# Patient Record
Sex: Male | Born: 1990 | Race: Black or African American | Hispanic: No | Marital: Single | Smoking: Current every day smoker
Health system: Southern US, Community
[De-identification: ages and names within clinical notes are randomized; demographics above are authoritative.]

---
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
## 2008-02-02 ENCOUNTER — Emergency Department (HOSPITAL_COMMUNITY): Admission: EM | Admit: 2008-02-02 | Discharge: 2008-02-03 | Payer: Self-pay | Admitting: Emergency Medicine

## 2008-02-04 ENCOUNTER — Emergency Department (HOSPITAL_COMMUNITY): Admission: EM | Admit: 2008-02-04 | Discharge: 2008-02-04 | Payer: Self-pay | Admitting: Emergency Medicine

## 2008-06-11 ENCOUNTER — Emergency Department (HOSPITAL_COMMUNITY): Admission: EM | Admit: 2008-06-11 | Discharge: 2008-06-11 | Payer: Self-pay | Admitting: Emergency Medicine

## 2008-11-21 IMAGING — CT CT PELVIS W/ CM
1 of 3 series · 14 of 32 positions shown, 19 images · IV contrast (Omnipaque 300)
Comparison: None

CLINICAL DATA: Abdominal pain

CT ABDOMEN WITH CONTRAST,CT PELVIS WITH CONTRAST
TECHNIQUE: Multidetector CT imaging of the abdomen was performed
following the standard protocol during bolus administration of
intravenous contrast.,Technique:  Multidetector CT imaging of the
pelvis was performed following the standard protocol during
Contrast: 100 ml Imnipaque-1UU IV and oral contrast media

[Series 2: abd_pel 5.0 b40f · axial · 0.69mm/px · z∈[-498,-82]mm · 14 of 95 slices shown, 19 images]
[im 6/95  soft-tissue]
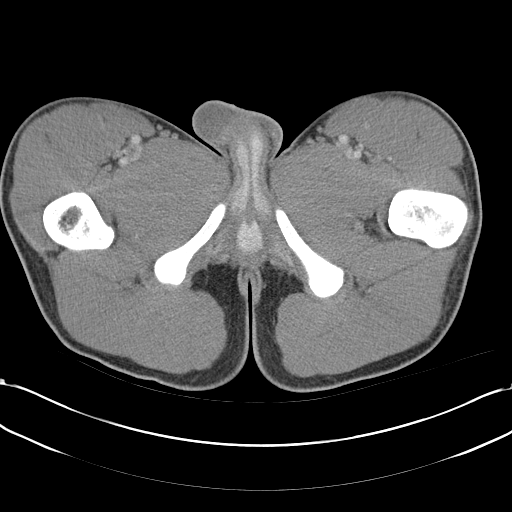
[im 6/95  bone]
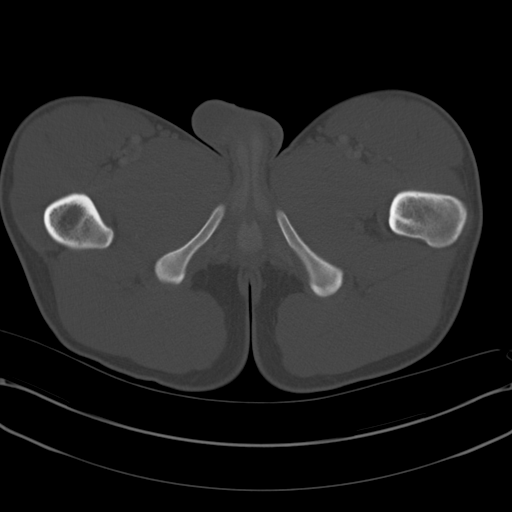
[im 12/95  soft-tissue]
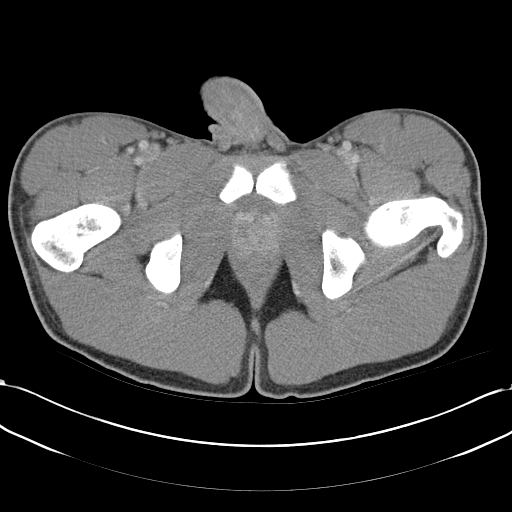
[im 23/95  soft-tissue]
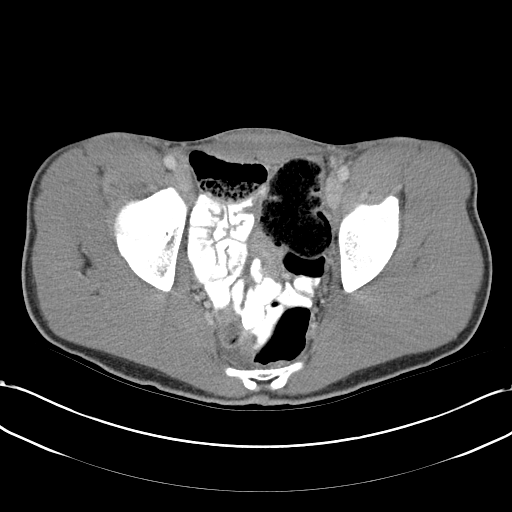
[im 28/95  soft-tissue]
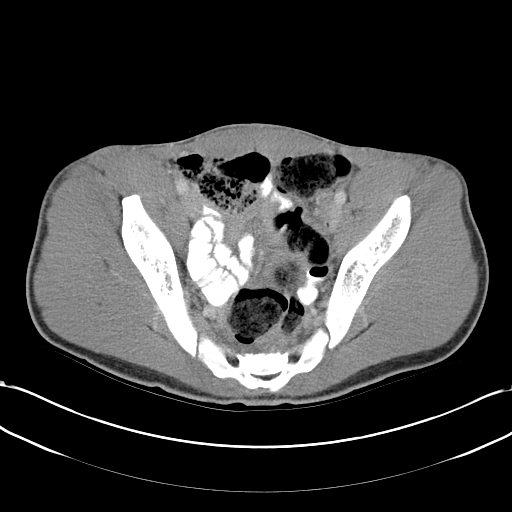
[im 34/95  soft-tissue]
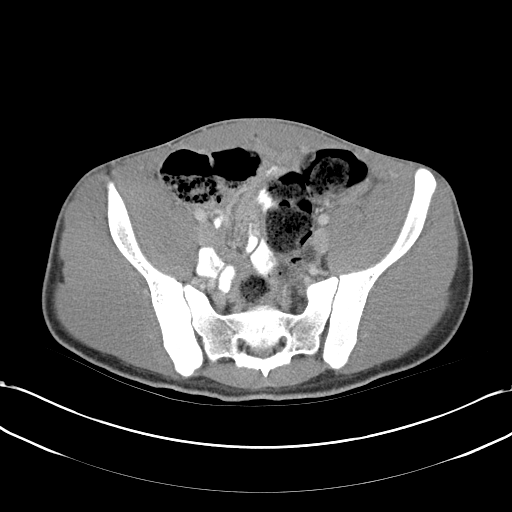
[im 39/95  soft-tissue]
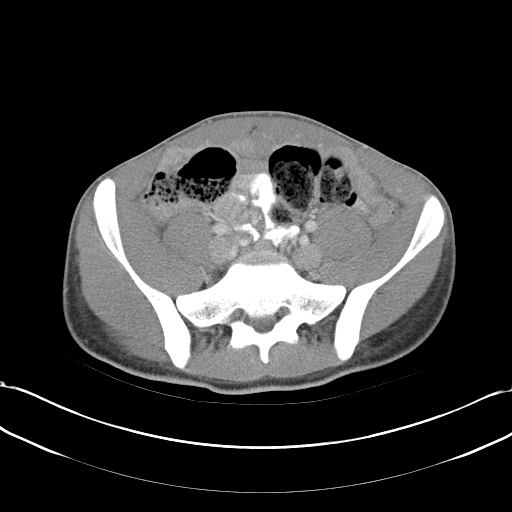
[im 50/95  soft-tissue]
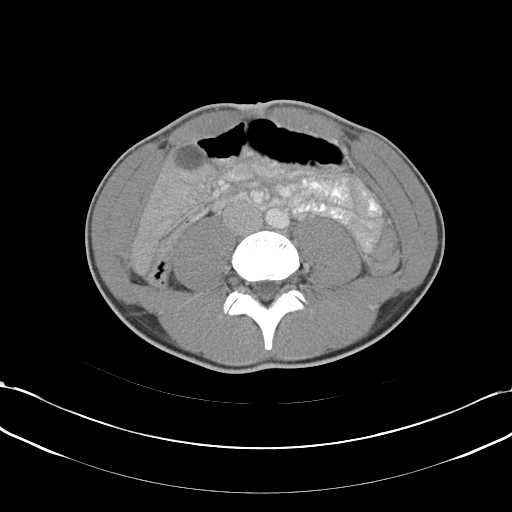
[im 56/95  soft-tissue]
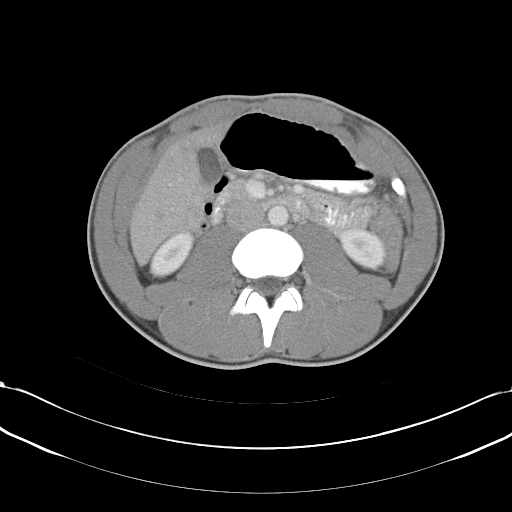
[im 61/95  soft-tissue]
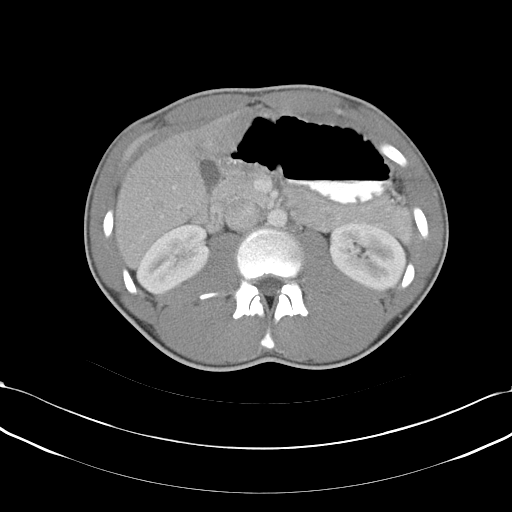
[im 61/95  bone]
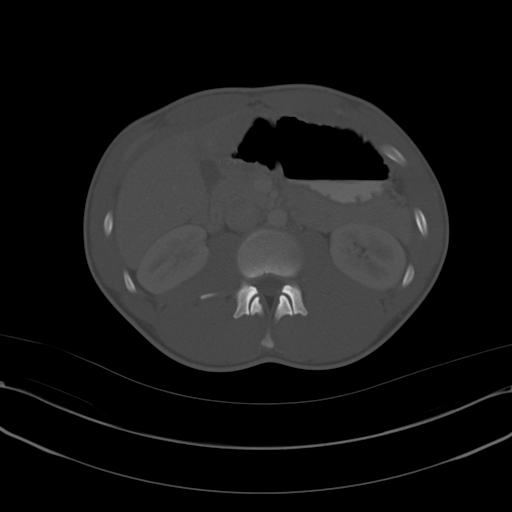
[im 67/95  soft-tissue]
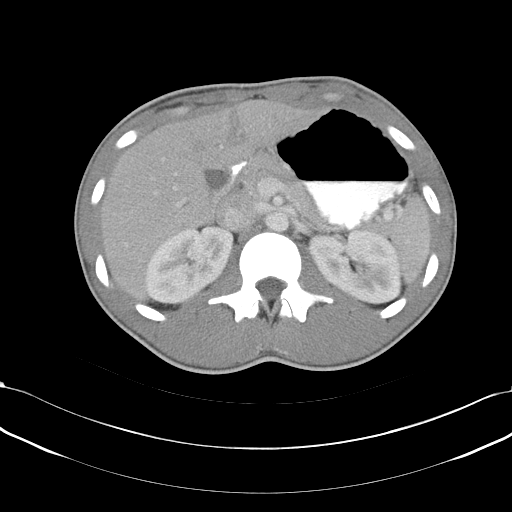
[im 72/95  soft-tissue]
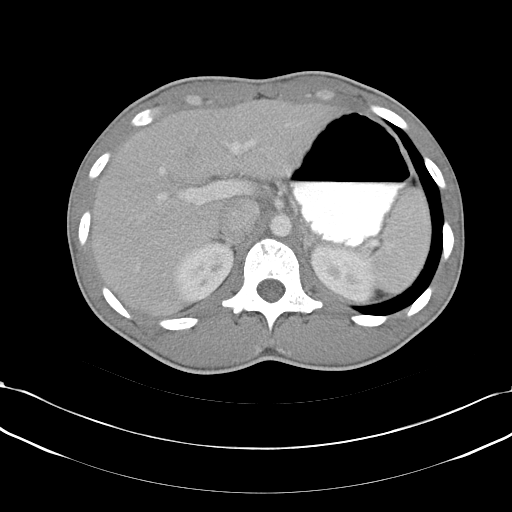
[im 72/95  lung]
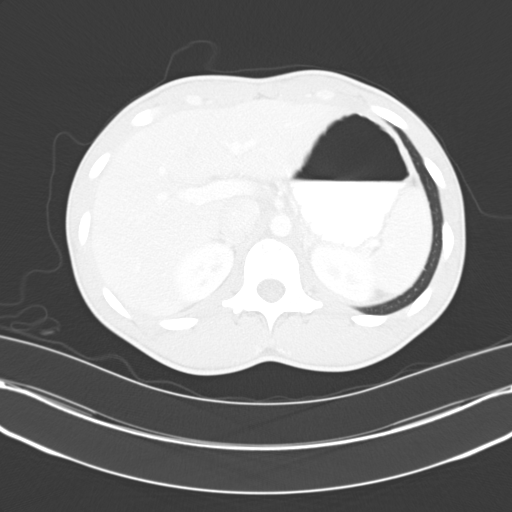
[im 78/95  lung]
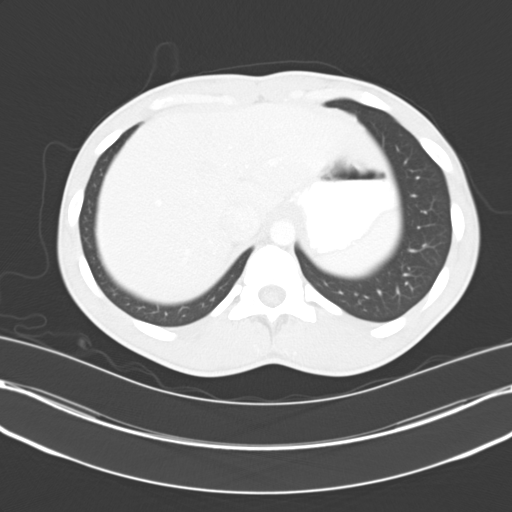
[im 83/95  soft-tissue]
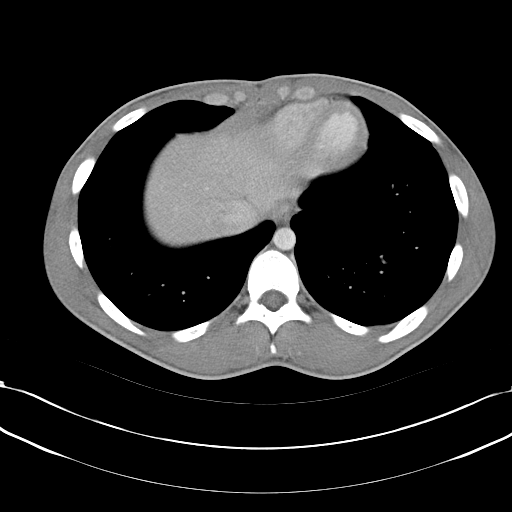
[im 83/95  lung]
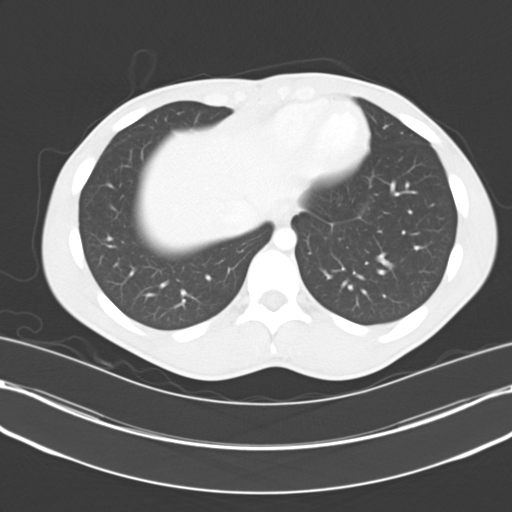
[im 89/95  soft-tissue]
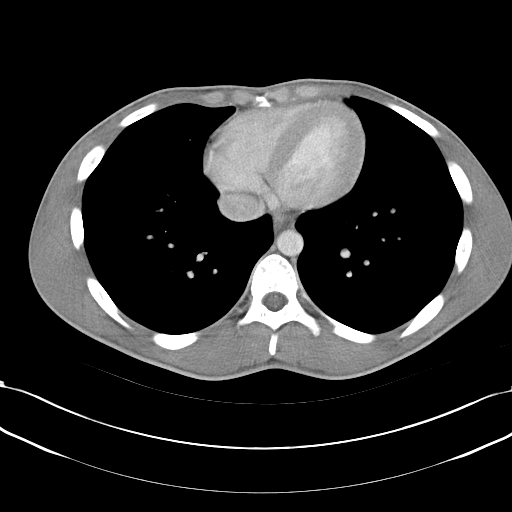
[im 89/95  lung]
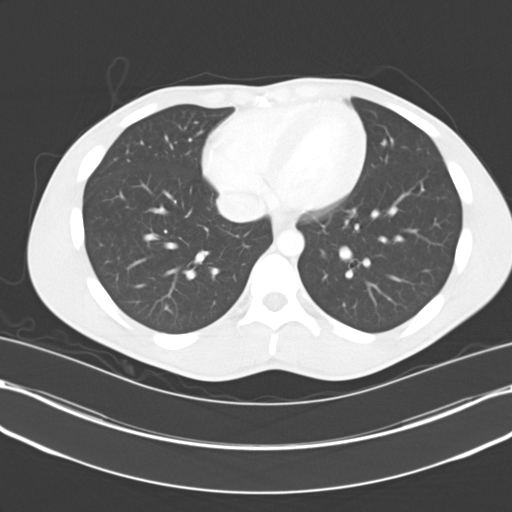

[14 of 32 positions shown; findings below may reference images not displayed]

FINDINGS: Unremarkable appearance of the liver cyst, spleen,
pancreas, kidneys, and adrenal glands.  No acute findings.  No free
fluid or free air.  No findings to suggest acute appendicitis or
diverticulitis.  There is a generous amounts stool in the colon.
IMPRESSION: No acute abdominal findings.

CT PELVIS WITH CONTRAST: No pelvic mass or free fluid.  Bladder
unremarkable.  No unusual bowel wall thickening.
IMPRESSION: No pelvic  findings.

## 2010-02-09 ENCOUNTER — Emergency Department (HOSPITAL_COMMUNITY): Admission: EM | Admit: 2010-02-09 | Discharge: 2010-02-09 | Payer: Self-pay | Admitting: Emergency Medicine

## 2011-06-02 LAB — DIFFERENTIAL
Basophils Absolute: 0
Basophils Relative: 0
Eosinophils Absolute: 0.2
Eosinophils Relative: 3
Lymphocytes Relative: 31
Lymphs Abs: 2
Monocytes Absolute: 0.6
Monocytes Relative: 8
Neutro Abs: 3.8
Neutrophils Relative %: 57

## 2011-06-02 LAB — CBC
HCT: 42.4
Hemoglobin: 15.1
MCHC: 35.6
MCV: 90.9
Platelets: 308
RBC: 4.67
RDW: 14.2
WBC: 6.7

## 2011-06-02 LAB — URINALYSIS, ROUTINE W REFLEX MICROSCOPIC
Glucose, UA: NEGATIVE
Hgb urine dipstick: NEGATIVE
Ketones, ur: 15 — AB
Nitrite: NEGATIVE
Protein, ur: NEGATIVE
Specific Gravity, Urine: >1.03 — ABNORMAL HIGH
Urobilinogen, UA: 0.2
pH: 5.5

## 2011-09-11 ENCOUNTER — Encounter: Payer: Self-pay | Admitting: *Deleted

## 2011-09-11 ENCOUNTER — Emergency Department (HOSPITAL_COMMUNITY)
Admission: EM | Admit: 2011-09-11 | Discharge: 2011-09-11 | Disposition: A | Discharge status: Home / Self Care | Payer: Self-pay | Attending: Emergency Medicine | Admitting: Emergency Medicine

## 2011-09-11 DIAGNOSIS — R51 Headache: Secondary | ICD-10-CM | POA: Insufficient documentation

## 2011-09-11 DIAGNOSIS — F17200 Nicotine dependence, unspecified, uncomplicated: Secondary | ICD-10-CM | POA: Insufficient documentation

## 2011-09-11 DIAGNOSIS — J069 Acute upper respiratory infection, unspecified: Secondary | ICD-10-CM | POA: Insufficient documentation

## 2011-09-11 DIAGNOSIS — K029 Dental caries, unspecified: Secondary | ICD-10-CM | POA: Insufficient documentation

## 2011-09-11 DIAGNOSIS — K1370 Unspecified lesions of oral mucosa: Secondary | ICD-10-CM | POA: Insufficient documentation

## 2011-09-11 DIAGNOSIS — K089 Disorder of teeth and supporting structures, unspecified: Secondary | ICD-10-CM | POA: Insufficient documentation

## 2011-09-11 DIAGNOSIS — R6884 Jaw pain: Secondary | ICD-10-CM | POA: Insufficient documentation

## 2011-09-11 DIAGNOSIS — R220 Localized swelling, mass and lump, head: Secondary | ICD-10-CM | POA: Insufficient documentation

## 2011-09-11 DIAGNOSIS — R221 Localized swelling, mass and lump, neck: Secondary | ICD-10-CM | POA: Insufficient documentation

## 2011-09-11 MED ORDER — AMOXICILLIN 500 MG PO CAPS: ORAL_CAPSULE | ORAL | Status: DC

## 2011-09-11 MED ORDER — PENICILLIN V POTASSIUM 250 MG PO TABS
500.0000 mg | ORAL_TABLET | Freq: Once | ORAL | Status: AC
  Administered 2011-09-11: 500 mg via ORAL
  Filled 2011-09-11: qty 2

## 2011-09-11 MED ORDER — ONDANSETRON HCL 4 MG PO TABS
4.0000 mg | ORAL_TABLET | Freq: Once | ORAL | Status: AC
  Administered 2011-09-11: 4 mg via ORAL
  Filled 2011-09-11: qty 1

## 2011-09-11 MED ORDER — AMOXICILLIN 500 MG PO CAPS: 500.0000 mg | ORAL_CAPSULE | Freq: Three times a day (TID) | ORAL | Status: AC

## 2011-09-11 MED ORDER — IBUPROFEN 800 MG PO TABS
800.0000 mg | ORAL_TABLET | Freq: Once | ORAL | Status: AC
  Administered 2011-09-11: 800 mg via ORAL
  Filled 2011-09-11: qty 1

## 2011-09-11 MED ORDER — HYDROCODONE-ACETAMINOPHEN 5-325 MG PO TABS
2.0000 | ORAL_TABLET | Freq: Once | ORAL | Status: AC
  Administered 2011-09-11: 2 via ORAL
  Filled 2011-09-11: qty 2

## 2011-09-11 MED ORDER — HYDROCODONE-ACETAMINOPHEN 5-325 MG PO TABS: 1.0000 | ORAL_TABLET | ORAL | Status: AC | PRN

## 2011-09-11 NOTE — ED Provider Notes (Addendum)
History     CSN: [id number]  Arrival date & time 09/11/11  1027   None     Chief Complaint  Patient presents with  . Dental Pain    (Consider location/radiation/quality/duration/timing/severity/associated sxs/prior treatment) Patient is a 21 y.o. male presenting with tooth pain. The history is provided by the patient.  Dental PainThe primary symptoms include mouth pain and headaches. Primary symptoms do not include shortness of breath, sore throat or cough. The symptoms began yesterday. The symptoms are worsening. The symptoms occur constantly.  The headache is not associated with photophobia.  Additional symptoms include: gum swelling, gum tenderness, jaw pain and facial swelling. Additional symptoms do not include: nosebleeds.    History reviewed. No pertinent past medical history.  History reviewed. No pertinent past surgical history.  History reviewed. No pertinent family history.  History  Substance Use Topics  . Smoking status: Current Everyday Smoker -- 0.5 packs/day  . Smokeless tobacco: Not on file  . Alcohol Use: Yes     occasional      Review of Systems  Constitutional: Negative for activity change.       All ROS Neg except as noted in HPI  HENT: Positive for facial swelling. Negative for nosebleeds, sore throat and neck pain.   Eyes: Negative for photophobia and discharge.  Respiratory: Negative for cough, shortness of breath and wheezing.   Cardiovascular: Negative for chest pain and palpitations.  Gastrointestinal: Negative for abdominal pain and blood in stool.  Genitourinary: Negative for dysuria, frequency and hematuria.  Musculoskeletal: Negative for back pain and arthralgias.  Skin: Negative.   Neurological: Positive for headaches. Negative for dizziness, seizures and speech difficulty.  Psychiatric/Behavioral: Negative for hallucinations and confusion.    Allergies  Review of patient's allergies indicates no known allergies.  Home Medications     Current Outpatient Rx  Name Route Sig Dispense Refill  . ACETAMINOPHEN 500 MG PO TABS Oral Take 1,000 mg by mouth every 6 (six) hours as needed. For pain     . PSEUDOEPHEDRINE HCL 30 MG PO TABS Oral Take 30 mg by mouth every 6 (six) hours as needed. For allergies       BP 135/83  Pulse 74  Temp(Src) 98.2 F (36.8 C) (Oral)  Resp 20  Ht 6' (1.829 m)  Wt 145 lb 5 oz (65.913 kg)  BMI 19.71 kg/m2  SpO2 97%  Physical Exam  Nursing note and vitals reviewed. Constitutional: He is oriented to person, place, and time. He appears well-developed and well-nourished.  Non-toxic appearance.  HENT:  Head: Normocephalic.  Right Ear: Tympanic membrane and external ear normal.  Left Ear: Tympanic membrane and external ear normal.       Multiple dental caries of the lower jaw area. Mild to mod left facial swelling.  Eyes: EOM and lids are normal. Pupils are equal, round, and reactive to light.  Neck: Normal range of motion. Neck supple. Carotid bruit is not present.  Cardiovascular: Normal rate, regular rhythm, normal heart sounds, intact distal pulses and normal pulses.   Pulmonary/Chest: Breath sounds normal. No respiratory distress.  Abdominal: Soft. Bowel sounds are normal. There is no tenderness. There is no guarding.  Musculoskeletal: Normal range of motion.  Lymphadenopathy:       Head (right side): No submandibular adenopathy present.       Head (left side): No submandibular adenopathy present.    He has no cervical adenopathy.  Neurological: He is alert and oriented to person, place, and time. He  has normal strength. No cranial nerve deficit or sensory deficit.  Skin: Skin is warm and dry.  Psychiatric: He has a normal mood and affect. His speech is normal.    ED Course  Procedures (including critical care time)  Labs Reviewed - No data to display No results found.   Dx: toothache.    MDM  I have reviewed nursing notes, vital signs, and all appropriate lab and imaging  results for this patient. Patient advised to see a dentist as soon as possible. Prescription for Amoxil 500 mg and Norco 5 mg given.       Kathie Dike, PA  [place], [place] 09/11/11 [phone]

## 2011-09-11 NOTE — ED Provider Notes (Signed)
Medical screening examination/treatment/procedure(s) were performed by non-physician practitioner and as supervising physician I was immediately available for consultation/collaboration.   Tymesha Ditmore L Pranay Hilbun, MD 09/11/11 1536 

## 2011-09-11 NOTE — ED Notes (Signed)
Pt c/o pain to left lower jaw and swelling.

## 2011-09-13 NOTE — ED Provider Notes (Signed)
Medical screening examination/treatment/procedure(s) were performed by non-physician practitioner and as supervising physician I was immediately available for consultation/collaboration.   Sherry Rogus L Jakayden Cancio, MD 09/13/11 1541 

## 2012-03-22 ENCOUNTER — Emergency Department (HOSPITAL_COMMUNITY)
Admission: EM | Admit: 2012-03-22 | Discharge: 2012-03-22 | Disposition: A | Discharge status: Home / Self Care | Payer: Self-pay | Attending: Emergency Medicine | Admitting: Emergency Medicine

## 2012-03-22 ENCOUNTER — Encounter (HOSPITAL_COMMUNITY): Payer: Self-pay | Admitting: *Deleted

## 2012-03-22 DIAGNOSIS — K047 Periapical abscess without sinus: Secondary | ICD-10-CM | POA: Insufficient documentation

## 2012-03-22 DIAGNOSIS — F17200 Nicotine dependence, unspecified, uncomplicated: Secondary | ICD-10-CM | POA: Insufficient documentation

## 2012-03-22 MED ORDER — PENICILLIN V POTASSIUM 250 MG PO TABS
500.0000 mg | ORAL_TABLET | Freq: Once | ORAL | Status: AC
  Administered 2012-03-22: 500 mg via ORAL
  Filled 2012-03-22: qty 2

## 2012-03-22 MED ORDER — HYDROCODONE-ACETAMINOPHEN 5-325 MG PO TABS
1.0000 | ORAL_TABLET | Freq: Once | ORAL | Status: AC
  Administered 2012-03-22: 1 via ORAL
  Filled 2012-03-22: qty 1

## 2012-03-22 MED ORDER — HYDROCODONE-ACETAMINOPHEN 5-325 MG PO TABS: 1.0000 | ORAL_TABLET | ORAL | Status: AC | PRN

## 2012-03-22 MED ORDER — PENICILLIN V POTASSIUM 500 MG PO TABS: 500.0000 mg | ORAL_TABLET | Freq: Three times a day (TID) | ORAL | Status: AC

## 2012-03-22 MED ORDER — HYDROCODONE-ACETAMINOPHEN 5-325 MG PO TABS: 1.0000 | ORAL_TABLET | ORAL | Status: DC | PRN

## 2012-03-22 MED ORDER — PENICILLIN V POTASSIUM 500 MG PO TABS: 500.0000 mg | ORAL_TABLET | Freq: Once | ORAL | Status: DC

## 2012-03-22 NOTE — ED Notes (Signed)
Pt c/o rt lower tooth pain since this am. Pt state it is a cavity that needs to be filled.

## 2012-03-22 NOTE — ED Provider Notes (Signed)
History     CSN: [id number]  Arrival date & time 03/22/12  1305   First MD Initiated Contact with Patient 03/22/12 1317      Chief Complaint  Patient presents with  . Dental Pain    (Consider location/radiation/quality/duration/timing/severity/associated sxs/prior treatment) HPI Comments: Harry Sullivan  presents with a 1 day history of dental pain and gingival swelling.   The patient has a history of injury to the tooth involved which has recently started to cause pain.  There has been no fevers,  Chills, nausea or vomiting, also no complaint of difficulty swallowing,  Although chewing makes pain worse.  The patient has tried ibuprofen and excedrin without relief of symptoms.      The history is provided by the patient.    History reviewed. No pertinent past medical history.  History reviewed. No pertinent past surgical history.  History reviewed. No pertinent family history.  History  Substance Use Topics  . Smoking status: Current Everyday Smoker -- 0.5 packs/day  . Smokeless tobacco: Not on file  . Alcohol Use: Yes     occasional      Review of Systems  Constitutional: Negative for fever.  HENT: Positive for dental problem. Negative for sore throat, facial swelling, neck pain and neck stiffness.   Respiratory: Negative for shortness of breath.     Allergies  Review of patient's allergies indicates no known allergies.  Home Medications   Current Outpatient Rx  Name Route Sig Dispense Refill  . ASPIRIN-ACETAMINOPHEN-CAFFEINE 250-250-65 MG PO TABS Oral Take 2 tablets by mouth every 6 (six) hours as needed. For headache    . IBUPROFEN 200 MG PO TABS Oral Take 600 mg by mouth every 6 (six) hours as needed. For tooth pain    . HYDROCODONE-ACETAMINOPHEN 5-325 MG PO TABS Oral Take 1 tablet by mouth every 4 (four) hours as needed for pain. 20 tablet 0  . PENICILLIN V POTASSIUM 500 MG PO TABS Oral Take 1 tablet (500 mg total) by mouth once. 30 tablet 0    BP  149/77  Pulse 60  Temp 98.3 F (36.8 C) (Oral)  Resp 18  Ht 6' (1.829 m)  Wt 150 lb (68.04 kg)  BMI 20.34 kg/m2  SpO2 98%  Physical Exam  Constitutional: He is oriented to person, place, and time. He appears well-developed and well-nourished. No distress.  HENT:  Head: Normocephalic and atraumatic.  Right Ear: Tympanic membrane and external ear normal.  Left Ear: Tympanic membrane and external ear normal.  Mouth/Throat: Oropharynx is clear and moist and mucous membranes are normal. No oral lesions. Dental abscesses present.         Subungual space soft.  Eyes: Conjunctivae are normal.  Neck: Normal range of motion. Neck supple.  Cardiovascular: Normal rate and normal heart sounds.   Pulmonary/Chest: Effort normal.  Lymphadenopathy:    He has no cervical adenopathy.  Neurological: He is alert and oriented to person, place, and time.  Skin: Skin is warm and dry. No erythema.  Psychiatric: He has a normal mood and affect.    ED Course  Procedures (including critical care time)  Labs Reviewed - No data to display No results found.   1. Dental abscess       MDM  Hydrocodone,  Penicillin.  Dental referral numbers given.  No trismus,  No induration of subungual space.  No facial edema.        Burgess Amor, PA 03/22/12 1343  Burgess Amor, PA 03/22/12 1344

## 2012-03-22 NOTE — ED Provider Notes (Signed)
Medical screening examination/treatment/procedure(s) were performed by non-physician practitioner and as supervising physician I was immediately available for consultation/collaboration.   Maely Clements L Carvin Almas, MD 03/22/12 1527 

## 2012-03-22 NOTE — ED Notes (Signed)
Rt mandibular tooth ache. Onset today.

## 2012-10-02 ENCOUNTER — Emergency Department (HOSPITAL_COMMUNITY)
Admission: EM | Admit: 2012-10-02 | Discharge: 2012-10-02 | Disposition: A | Discharge status: Home / Self Care | Payer: Self-pay | Attending: Emergency Medicine | Admitting: Emergency Medicine

## 2012-10-02 ENCOUNTER — Encounter (HOSPITAL_COMMUNITY): Payer: Self-pay | Admitting: Emergency Medicine

## 2012-10-02 ENCOUNTER — Emergency Department (HOSPITAL_COMMUNITY): Payer: Self-pay

## 2012-10-02 DIAGNOSIS — R110 Nausea: Secondary | ICD-10-CM | POA: Insufficient documentation

## 2012-10-02 DIAGNOSIS — F17200 Nicotine dependence, unspecified, uncomplicated: Secondary | ICD-10-CM | POA: Insufficient documentation

## 2012-10-02 DIAGNOSIS — R51 Headache: Secondary | ICD-10-CM | POA: Insufficient documentation

## 2012-10-02 MED ORDER — DIPHENHYDRAMINE HCL 25 MG PO CAPS
50.0000 mg | ORAL_CAPSULE | Freq: Once | ORAL | Status: AC
  Administered 2012-10-02: 50 mg via ORAL
  Filled 2012-10-02: qty 2

## 2012-10-02 MED ORDER — HYDROCODONE-ACETAMINOPHEN 5-325 MG PO TABS: 1.0000 | ORAL_TABLET | ORAL | Status: DC | PRN

## 2012-10-02 MED ORDER — HYDROCODONE-ACETAMINOPHEN 5-325 MG PO TABS
1.0000 | ORAL_TABLET | Freq: Once | ORAL | Status: AC
  Administered 2012-10-02: 1 via ORAL
  Filled 2012-10-02: qty 1

## 2012-10-02 MED ORDER — METOCLOPRAMIDE HCL 10 MG PO TABS
10.0000 mg | ORAL_TABLET | Freq: Once | ORAL | Status: AC
  Administered 2012-10-02: 10 mg via ORAL
  Filled 2012-10-02: qty 1

## 2012-10-02 MED ORDER — IBUPROFEN 800 MG PO TABS
800.0000 mg | ORAL_TABLET | Freq: Once | ORAL | Status: AC
  Administered 2012-10-02: 800 mg via ORAL
  Filled 2012-10-02: qty 1

## 2012-10-02 MED ORDER — PROMETHAZINE HCL 25 MG PO TABS: 25.0000 mg | ORAL_TABLET | Freq: Four times a day (QID) | ORAL | Status: DC | PRN

## 2012-10-02 NOTE — ED Notes (Signed)
Pt states headache since yesterday.denies history of same. Pt states nausea but no vomiting.

## 2012-10-02 NOTE — ED Notes (Signed)
Pt reports headache since yesterday. Mild nausea, no vomiting, no visual disturbances

## 2012-10-03 NOTE — ED Provider Notes (Signed)
Medical screening examination/treatment/procedure(s) were performed by non-physician practitioner and as supervising physician I was immediately available for consultation/collaboration. Devoria Albe, MD, Armando Gang   Ward Givens, MD 10/03/12 [phone]

## 2012-10-03 NOTE — ED Provider Notes (Signed)
History     CSN: [id number]  Arrival date & time 10/02/12  1012   First MD Initiated Contact with Patient 10/02/12 1049      Chief Complaint  Patient presents with  . Headache    (Consider location/radiation/quality/duration/timing/severity/associated sxs/prior treatment) HPI Comments: Harry Sullivan is a 22 y.o. Male presenting with new onset headache which started gradually yesterday.  He denies history of headache.  He reports pain across his forehead to both temples and has noticed slightly blurred vision in his left eye. He denies prodromal symptoms.  He has had slight nausea without emesis.  He denies recent illness including nasal congestion, facial pain,  Earache and sore throat.  There has been no fevers, chills, syncope, confusion or localized weakness.  The patient tried tylenol without relief of symptoms.       The history is provided by the patient.    History reviewed. No pertinent past medical history.  History reviewed. No pertinent past surgical history.  No family history on file.  History  Substance Use Topics  . Smoking status: Current Every Day Smoker -- 0.5 packs/day  . Smokeless tobacco: Not on file  . Alcohol Use: Yes     Comment: occasional      Review of Systems  Constitutional: Negative for fever.  HENT: Negative for ear pain, congestion, sore throat, neck pain and tinnitus.   Eyes: Positive for visual disturbance. Negative for photophobia, pain, discharge and redness.  Respiratory: Negative for chest tightness and shortness of breath.   Cardiovascular: Negative for chest pain.  Gastrointestinal: Negative for nausea and abdominal pain.  Genitourinary: Negative.   Musculoskeletal: Negative for joint swelling and arthralgias.  Skin: Negative.  Negative for rash and wound.  Neurological: Positive for headaches. Negative for dizziness, speech difficulty, weakness, light-headedness and numbness.  Hematological: Negative.     Psychiatric/Behavioral: Negative.     Allergies  Review of patient's allergies indicates no known allergies.  Home Medications   Current Outpatient Rx  Name  Route  Sig  Dispense  Refill  . ACETAMINOPHEN 500 MG PO TABS   Oral   Take 1,000 mg by mouth every 6 (six) hours as needed. For pain         . HYDROCODONE-ACETAMINOPHEN 5-325 MG PO TABS   Oral   Take 1 tablet by mouth every 4 (four) hours as needed for pain.   15 tablet   0   . PROMETHAZINE HCL 25 MG PO TABS   Oral   Take 1 tablet (25 mg total) by mouth every 6 (six) hours as needed for nausea.   15 tablet   0     BP 134/74  Pulse 72  Temp 98 F (36.7 C) (Oral)  Resp 16  Wt 150 lb (68.04 kg)  SpO2 99%  Physical Exam  Nursing note and vitals reviewed. Constitutional: He is oriented to person, place, and time. He appears well-developed and well-nourished.  HENT:  Head: Normocephalic and atraumatic.  Mouth/Throat: Oropharynx is clear and moist.  Eyes: Conjunctivae normal and EOM are normal. Pupils are equal, round, and reactive to light.  Neck: Normal range of motion. Neck supple.  Cardiovascular: Normal rate and normal heart sounds.   Pulmonary/Chest: Effort normal.  Abdominal: Soft. There is no tenderness.  Musculoskeletal: Normal range of motion.  Lymphadenopathy:    He has no cervical adenopathy.  Neurological: He is alert and oriented to person, place, and time. He has normal strength. No sensory deficit. Gait normal. GCS  eye subscore is 4. GCS verbal subscore is 5. GCS motor subscore is 6.       Normal heel-shin, normal rapid alternating movements. Cranial nerves III-XII intact.  No pronator drift.  Skin: Skin is warm and dry. No rash noted.  Psychiatric: He has a normal mood and affect. His speech is normal and behavior is normal. Thought content normal. Cognition and memory are normal.    ED Course  Procedures (including critical care time)  Labs Reviewed - No data to display Ct Head Wo  Contrast  10/02/2012  *RADIOLOGY REPORT*  Clinical Data: Headache for 1 day duration  CT HEAD WITHOUT CONTRAST  Technique:  Contiguous axial images were obtained from the base of the skull through the vertex without contrast.  Comparison: None.  Findings:  The ventricles are normal in size and configuration. There is no mass, hemorrhage, extra-axial fluid collection, or midline shift.  Gray-white compartments are normal.  Bony calvarium appears intact.  The mastoid air cells are clear.  IMPRESSION: Study within normal limits.   Original Report Authenticated By: Bretta Bang, M.D.      1. Headache, acute     Patient was given migraine cocktail including reglan, benadryl and ibuprofen PO (refused IM).  Nausea resolved,  Headache 3/6 from 6/6.  Given hydrocodone prior to dc home.  MDM  CT reviewed and negative.  Discussed with pt and mother.  Nonfocal neuro exam.  Pt's visual acuity 20/20 os/od/ou.  Suspect simple headache with no other exam findings.  Prescribed phenergan, hydrocodone.  F/u with pcp prn.        Burgess Amor, PA 10/03/12 0831  Burgess Amor, PA 10/03/12 [phone]

## 2013-07-20 IMAGING — CT CT HEAD W/O CM
1 series · 16 of 30 positions shown, 20 images · non-contrast
Comparison: None.

CLINICAL DATA: Headache for 1 day duration

CT HEAD WITHOUT CONTRAST
TECHNIQUE: Contiguous axial images were obtained from the base of
the skull through the vertex without contrast.

[Series 2: headtrauma 4.8 h37s · axial · 0.43mm/px · z∈[+53,+207]mm · 16 of 36 slices shown, 20 images]
[im 2/36  brain]
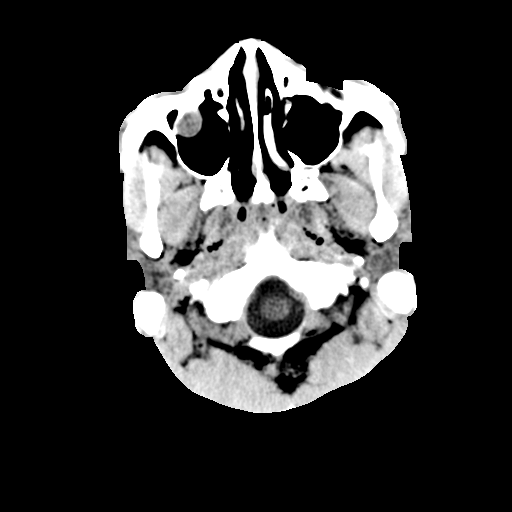
[im 2/36  bone]
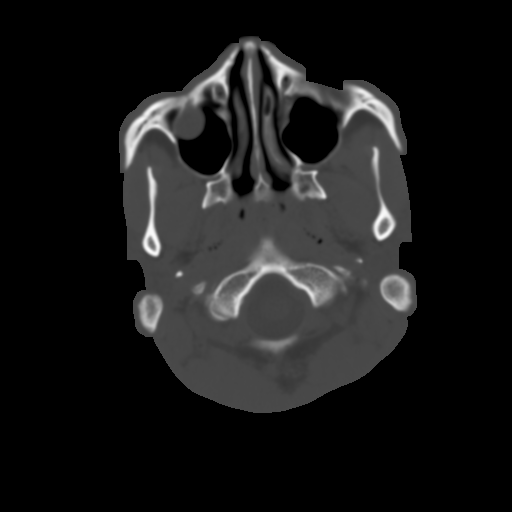
[im 4/36  brain]
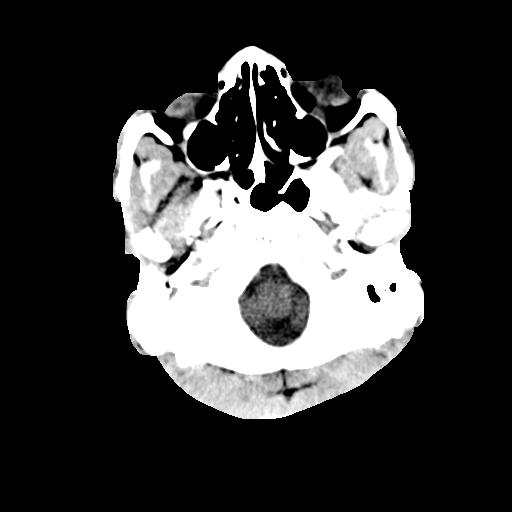
[im 7/36  brain]
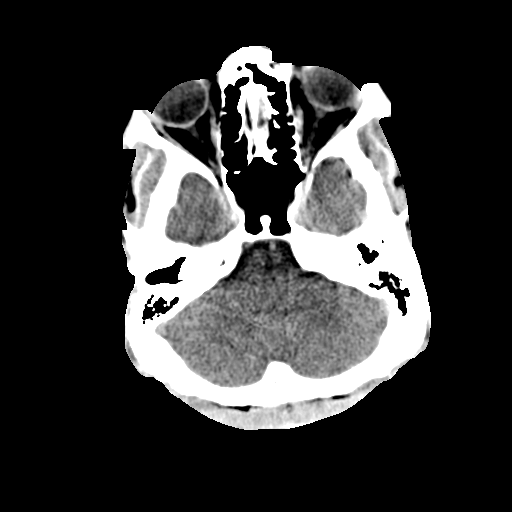
[im 9/36  brain]
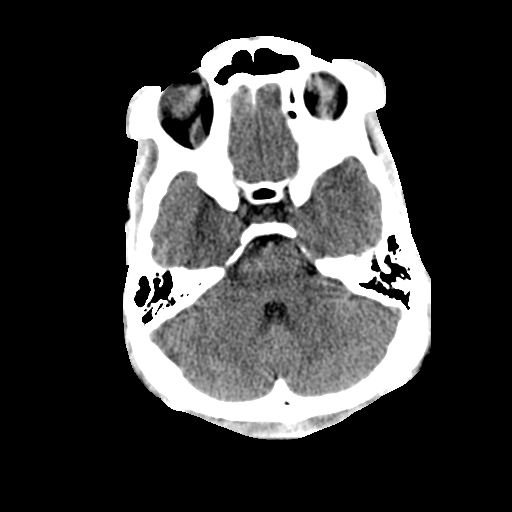
[im 10/36  brain]
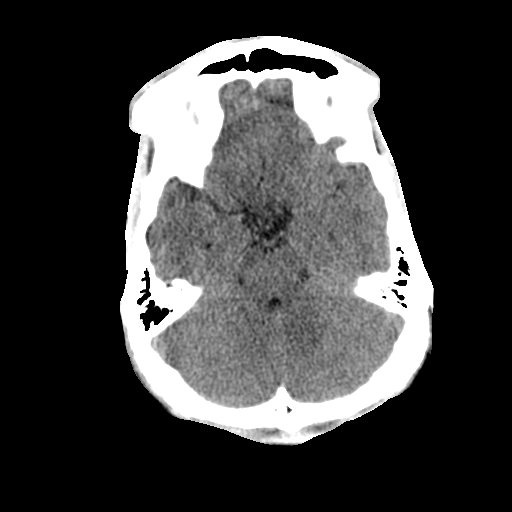
[im 10/36  bone]
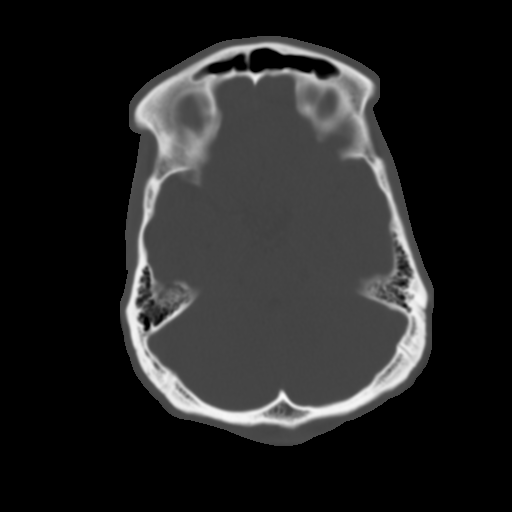
[im 13/36  brain]
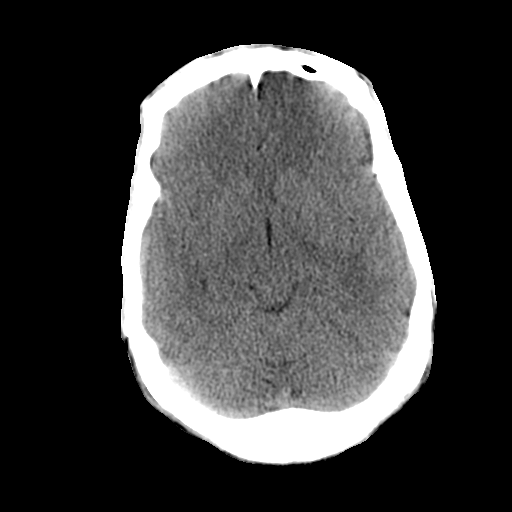
[im 15/36  brain]
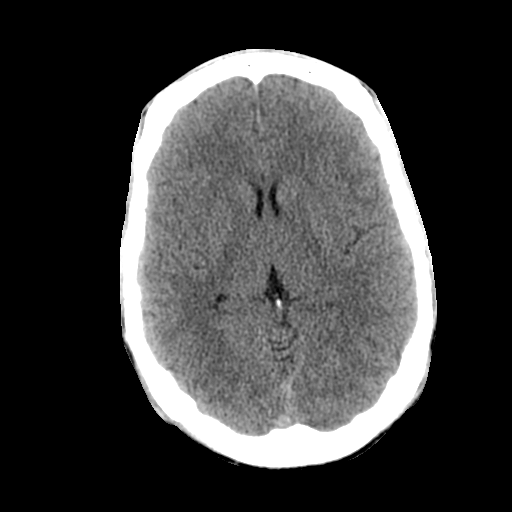
[im 17/36  brain]
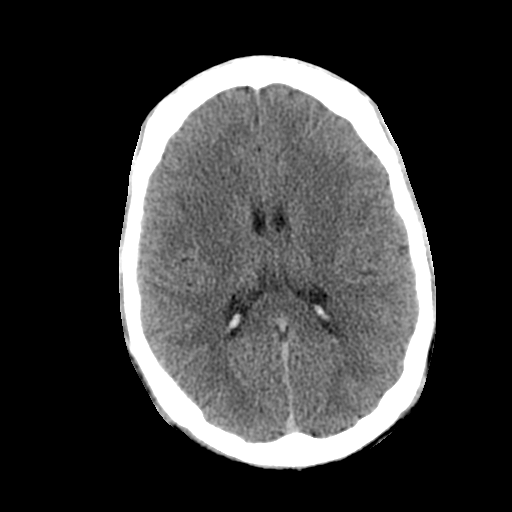
[im 19/36  brain]
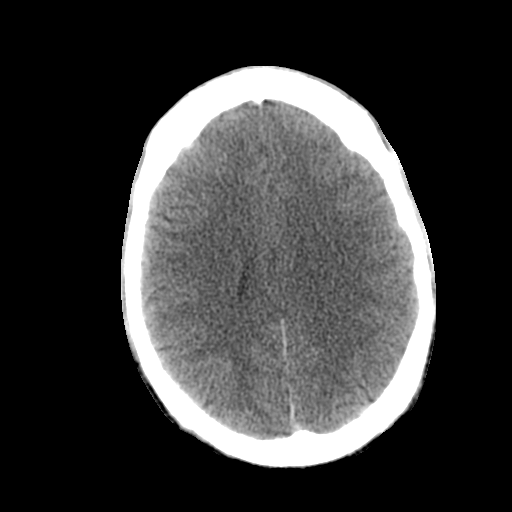
[im 19/36  bone]
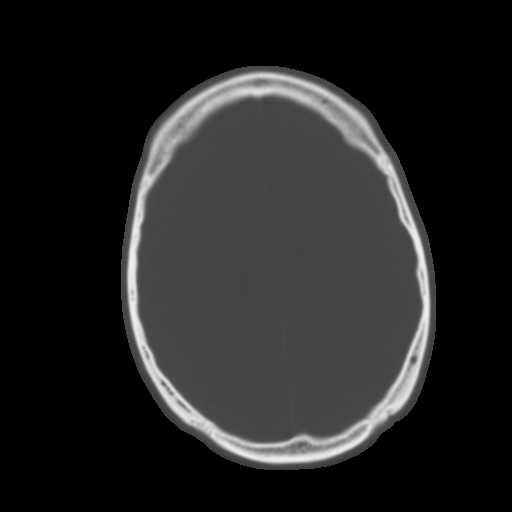
[im 21/36  brain]
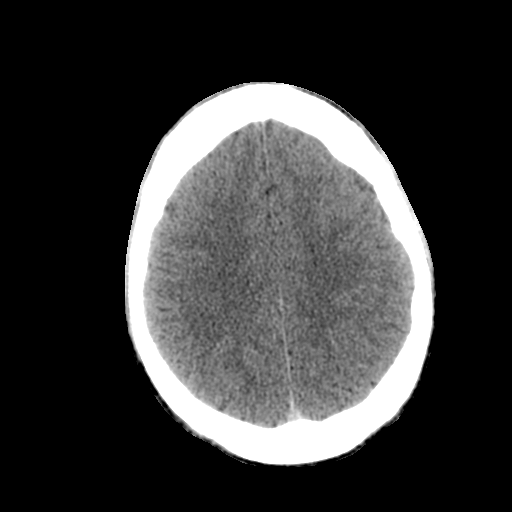
[im 23/36  brain]
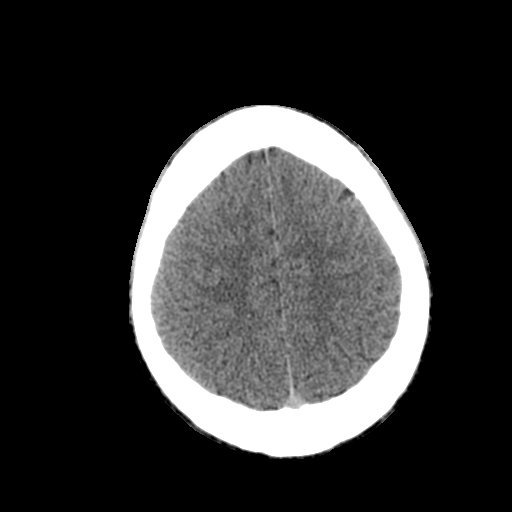
[im 26/36  brain]
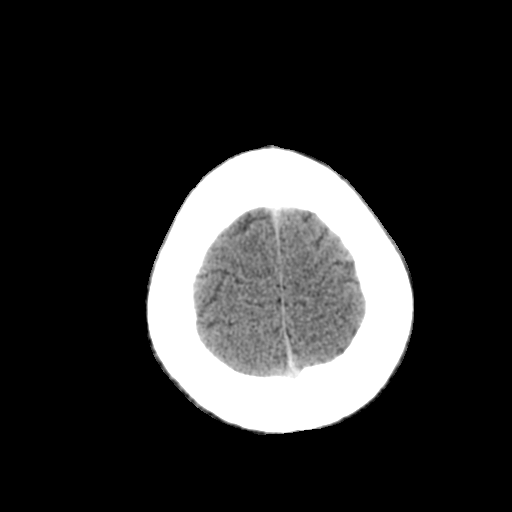
[im 27/36  brain]
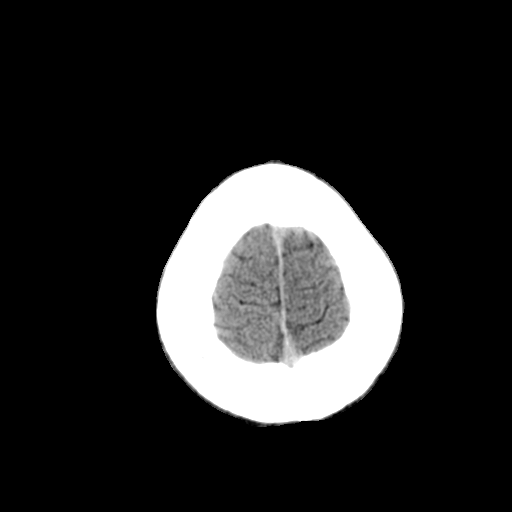
[im 27/36  bone]
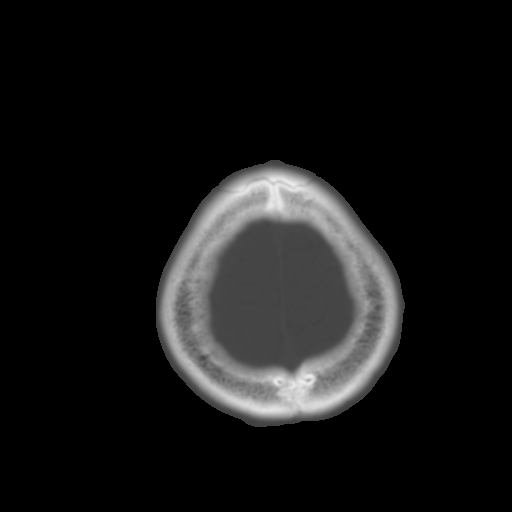
[im 29/36  brain]
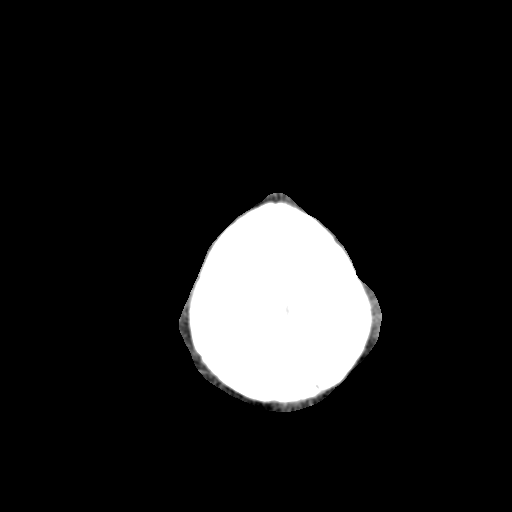
[im 32/36  brain]
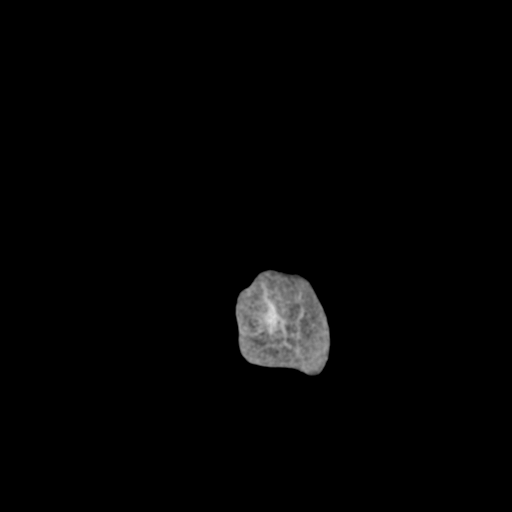
[im 34/36  brain]
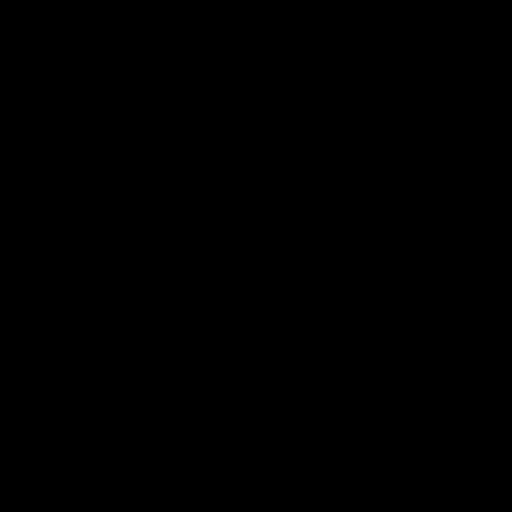

[16 of 30 positions shown; findings below may reference images not displayed]

FINDINGS: The ventricles are normal in size and configuration.
There is no mass, hemorrhage, extra-axial fluid collection, or
midline shift.  Gray-white compartments are normal.  Bony calvarium
appears intact.  The mastoid air cells are clear.
IMPRESSION: Study within normal limits.

## 2014-09-16 ENCOUNTER — Emergency Department (HOSPITAL_COMMUNITY)
Admission: EM | Admit: 2014-09-16 | Discharge: 2014-09-16 | Disposition: A | Discharge status: Home / Self Care | Payer: Self-pay | Attending: Emergency Medicine | Admitting: Emergency Medicine

## 2014-09-16 ENCOUNTER — Encounter (HOSPITAL_COMMUNITY): Payer: Self-pay

## 2014-09-16 DIAGNOSIS — Y939 Activity, unspecified: Secondary | ICD-10-CM | POA: Insufficient documentation

## 2014-09-16 DIAGNOSIS — Y929 Unspecified place or not applicable: Secondary | ICD-10-CM | POA: Insufficient documentation

## 2014-09-16 DIAGNOSIS — X58XXXA Exposure to other specified factors, initial encounter: Secondary | ICD-10-CM | POA: Insufficient documentation

## 2014-09-16 DIAGNOSIS — Y998 Other external cause status: Secondary | ICD-10-CM | POA: Insufficient documentation

## 2014-09-16 DIAGNOSIS — Z720 Tobacco use: Secondary | ICD-10-CM | POA: Insufficient documentation

## 2014-09-16 DIAGNOSIS — S39012A Strain of muscle, fascia and tendon of lower back, initial encounter: Secondary | ICD-10-CM | POA: Insufficient documentation

## 2014-09-16 MED ORDER — BACLOFEN 10 MG PO TABS: 10.0000 mg | ORAL_TABLET | Freq: Three times a day (TID) | ORAL | Status: AC

## 2014-09-16 MED ORDER — NAPROXEN 250 MG PO TABS
500.0000 mg | ORAL_TABLET | Freq: Once | ORAL | Status: AC
  Administered 2014-09-16: 500 mg via ORAL
  Filled 2014-09-16: qty 2

## 2014-09-16 MED ORDER — DICLOFENAC SODIUM 75 MG PO TBEC: 75.0000 mg | DELAYED_RELEASE_TABLET | Freq: Two times a day (BID) | ORAL | Status: DC

## 2014-09-16 MED ORDER — METHOCARBAMOL 500 MG PO TABS
1000.0000 mg | ORAL_TABLET | Freq: Once | ORAL | Status: AC
  Administered 2014-09-16: 1000 mg via ORAL
  Filled 2014-09-16: qty 2

## 2014-09-16 MED ORDER — HYDROCODONE-ACETAMINOPHEN 5-325 MG PO TABS
2.0000 | ORAL_TABLET | Freq: Once | ORAL | Status: AC
  Administered 2014-09-16: 2 via ORAL
  Filled 2014-09-16: qty 2

## 2014-09-16 MED ORDER — HYDROCODONE-ACETAMINOPHEN 5-325 MG PO TABS: 1.0000 | ORAL_TABLET | ORAL | Status: DC | PRN

## 2014-09-16 NOTE — ED Provider Notes (Signed)
CSN: [id number]     Arrival date & time 09/16/14  2140 History   First MD Initiated Contact with Patient 09/16/14 2148     Chief Complaint  Patient presents with  . Back Pain     (Consider location/radiation/quality/duration/timing/severity/associated sxs/prior Treatment) Patient is a 24 y.o. male presenting with back pain. The history is provided by the patient.  Back Pain Location:  Lumbar spine Quality:  Stiffness and aching Stiffness is present:  All day Pain severity:  Moderate Pain is:  Same all the time Onset quality:  Gradual Duration:  1 month Timing:  Sporadic Progression:  Worsening Chronicity:  New Context: lifting heavy objects   Relieved by:  Nothing Worsened by:  Nothing tried Ineffective treatments:  None tried Associated symptoms: no abdominal pain, no bladder incontinence, no bowel incontinence, no chest pain, no dysuria, no numbness and no tingling   Risk factors: vascular disease   Risk factors: not obese     History reviewed. No pertinent past medical history. History reviewed. No pertinent past surgical history. History reviewed. No pertinent family history. History  Substance Use Topics  . Smoking status: Current Every Day Smoker -- 0.50 packs/day  . Smokeless tobacco: Not on file  . Alcohol Use: Yes     Comment: occasional    Review of Systems  Constitutional: Negative for activity change.       All ROS Neg except as noted in HPI  HENT: Negative for nosebleeds.   Eyes: Negative for photophobia and discharge.  Respiratory: Negative for cough, shortness of breath and wheezing.   Cardiovascular: Negative for chest pain and palpitations.  Gastrointestinal: Negative for abdominal pain, blood in stool and bowel incontinence.  Genitourinary: Negative for bladder incontinence, dysuria, frequency and hematuria.  Musculoskeletal: Positive for back pain. Negative for arthralgias and neck pain.  Skin: Negative.   Neurological: Negative for dizziness,  tingling, seizures, speech difficulty and numbness.  Psychiatric/Behavioral: Negative for hallucinations and confusion.      Allergies  Review of patient's allergies indicates no known allergies.  Home Medications   Prior to Admission medications   Medication Sig Start Date End Date Taking? Authorizing Provider  acetaminophen (TYLENOL) 500 MG tablet Take 1,000 mg by mouth every 6 (six) hours as needed. For pain    Historical Provider, MD  HYDROcodone-acetaminophen (NORCO/VICODIN) 5-325 MG per tablet Take 1 tablet by mouth every 4 (four) hours as needed for pain. 10/02/12   Burgess AmorJulie Idol, PA-C  promethazine (PHENERGAN) 25 MG tablet Take 1 tablet (25 mg total) by mouth every 6 (six) hours as needed for nausea. 10/02/12   Burgess AmorJulie Idol, PA-C   BP 154/89 mmHg  Pulse 88  Temp(Src) 98.6 F (37 C) (Oral)  Resp 16  Ht 6\' 1"  (1.854 m)  Wt 150 lb (68.04 kg)  BMI 19.79 kg/m2  SpO2 99% Physical Exam  Constitutional: He is oriented to person, place, and time. He appears well-developed and well-nourished.  Non-toxic appearance.  HENT:  Head: Normocephalic.  Right Ear: Tympanic membrane and external ear normal.  Left Ear: Tympanic membrane and external ear normal.  Eyes: EOM and lids are normal. Pupils are equal, round, and reactive to light.  Neck: Normal range of motion. Neck supple. Carotid bruit is not present.  Cardiovascular: Normal rate, regular rhythm, normal heart sounds, intact distal pulses and normal pulses.   Pulmonary/Chest: Breath sounds normal. No respiratory distress.  Abdominal: Soft. Bowel sounds are normal. There is no tenderness. There is no guarding.  Musculoskeletal:  Lumbar back: He exhibits decreased range of motion, tenderness, bony tenderness, pain and spasm. He exhibits no deformity.  Lymphadenopathy:       Head (right side): No submandibular adenopathy present.       Head (left side): No submandibular adenopathy present.    He has no cervical adenopathy.   Neurological: He is alert and oriented to person, place, and time. He has normal strength. No cranial nerve deficit or sensory deficit.  Skin: Skin is warm and dry.  Psychiatric: He has a normal mood and affect. His speech is normal.  Nursing note and vitals reviewed.   ED Course  Procedures (including critical care time) Labs Review Labs Reviewed - No data to display  Imaging Review No results found.   EKG Interpretation None      MDM  Pt to use heating pad and rest. Work note provided. Pt to use norco, diclofenac,  And rest for pail   Final diagnoses:  None    **I have reviewed nursing notes, vital signs, and all appropriate lab and imaging results for this patient.Kathie Dike, PA-C 09/16/14 2259  Layla Maw Ward, DO 09/16/14 2300

## 2014-09-16 NOTE — Discharge Instructions (Signed)
Heating pad to the back and rest may be helpful. Take meds as suggested. Baclofen  and norco may cause drowsiness, use with caution. Back Pain, Adult Low back pain is very common. About 1 in 5 people have back pain.The cause of low back pain is rarely dangerous. The pain often gets better over time.About half of people with a sudden onset of back pain feel better in just 2 weeks. About 8 in 10 people feel better by 6 weeks.  CAUSES Some common causes of back pain include:  Strain of the muscles or ligaments supporting the spine.  Wear and tear (degeneration) of the spinal discs.  Arthritis.  Direct injury to the back. DIAGNOSIS Most of the time, the direct cause of low back pain is not known.However, back pain can be treated effectively even when the exact cause of the pain is unknown.Answering your caregiver's questions about your overall health and symptoms is one of the most accurate ways to make sure the cause of your pain is not dangerous. If your caregiver needs more information, he or she may order lab work or imaging tests (X-rays or MRIs).However, even if imaging tests show changes in your back, this usually does not require surgery. HOME CARE INSTRUCTIONS For many people, back pain returns.Since low back pain is rarely dangerous, it is often a condition that people can learn to [facility] their own.   Remain active. It is stressful on the back to sit or stand in one place. Do not sit, drive, or stand in one place for more than 30 minutes at a time. Take short walks on level surfaces as soon as pain allows.Try to increase the length of time you walk each day.  Do not stay in bed.Resting more than 1 or 2 days can delay your recovery.  Do not avoid exercise or work.Your body is made to move.It is not dangerous to be active, even though your back may hurt.Your back will likely heal faster if you return to being active before your pain is gone.  Pay attention to your body when  you bend and lift. Many people have less discomfortwhen lifting if they bend their knees, keep the load close to their bodies,and avoid twisting. Often, the most comfortable positions are those that put less stress on your recovering back.  Find a comfortable position to sleep. Use a firm mattress and lie on your side with your knees slightly bent. If you lie on your back, put a pillow under your knees.  Only take over-the-counter or prescription medicines as directed by your caregiver. Over-the-counter medicines to reduce pain and inflammation are often the most helpful.Your caregiver may prescribe muscle relaxant drugs.These medicines help dull your pain so you can more quickly return to your normal activities and healthy exercise.  Put ice on the injured area.  Put ice in a plastic bag.  Place a towel between your skin and the bag.  Leave the ice on for 15-20 minutes, 03-04 times a day for the first 2 to 3 days. After that, ice and heat may be alternated to reduce pain and spasms.  Ask your caregiver about trying back exercises and gentle massage. This may be of some benefit.  Avoid feeling anxious or stressed.Stress increases muscle tension and can worsen back pain.It is important to recognize when you are anxious or stressed and learn ways to manage it.Exercise is a great option. SEEK MEDICAL CARE IF:  You have pain that is not relieved with rest or medicine.  You have pain that does not improve in 1 week.  You have new symptoms.  You are generally not feeling well. SEEK IMMEDIATE MEDICAL CARE IF:   You have pain that radiates from your back into your legs.  You develop new bowel or bladder control problems.  You have unusual weakness or numbness in your arms or legs.  You develop nausea or vomiting.  You develop abdominal pain.  You feel faint. Document Released: 08/22/2005 Document Revised: 02/21/2012 Document Reviewed: 12/24/2013 [facility] Patient Information  2015 [place] [place]. This information is not intended to replace advice given to you by your health care provider. Make sure you discuss any questions you have with your health care provider.

## 2014-09-16 NOTE — ED Notes (Signed)
Pt verbalized understanding of no driving and to use caution within 4 hours of taking pain meds due to meds cause drowsiness 

## 2014-09-16 NOTE — ED Notes (Signed)
Patient states lower back pain in the middle of his back X1 month. Patient denies lower extremity discomfort, denies urinary symptoms and denies injury. Patient is ambulatory with a steady but guarded gait.

## 2016-10-18 ENCOUNTER — Encounter (HOSPITAL_COMMUNITY): Payer: Self-pay | Admitting: Emergency Medicine

## 2016-10-18 ENCOUNTER — Emergency Department (HOSPITAL_COMMUNITY)
Admission: EM | Admit: 2016-10-18 | Discharge: 2016-10-18 | Disposition: A | Discharge status: Home / Self Care | Payer: Self-pay | Attending: Emergency Medicine | Admitting: Emergency Medicine

## 2016-10-18 DIAGNOSIS — J111 Influenza due to unidentified influenza virus with other respiratory manifestations: Secondary | ICD-10-CM

## 2016-10-18 DIAGNOSIS — R197 Diarrhea, unspecified: Secondary | ICD-10-CM | POA: Insufficient documentation

## 2016-10-18 DIAGNOSIS — R0982 Postnasal drip: Secondary | ICD-10-CM | POA: Insufficient documentation

## 2016-10-18 DIAGNOSIS — M791 Myalgia: Secondary | ICD-10-CM | POA: Insufficient documentation

## 2016-10-18 DIAGNOSIS — R0981 Nasal congestion: Secondary | ICD-10-CM | POA: Insufficient documentation

## 2016-10-18 DIAGNOSIS — R51 Headache: Secondary | ICD-10-CM | POA: Insufficient documentation

## 2016-10-18 DIAGNOSIS — F17200 Nicotine dependence, unspecified, uncomplicated: Secondary | ICD-10-CM | POA: Insufficient documentation

## 2016-10-18 DIAGNOSIS — R69 Illness, unspecified: Secondary | ICD-10-CM

## 2016-10-18 DIAGNOSIS — R05 Cough: Secondary | ICD-10-CM | POA: Insufficient documentation

## 2016-10-18 DIAGNOSIS — J029 Acute pharyngitis, unspecified: Secondary | ICD-10-CM | POA: Insufficient documentation

## 2016-10-18 DIAGNOSIS — R509 Fever, unspecified: Secondary | ICD-10-CM | POA: Insufficient documentation

## 2016-10-18 MED ORDER — GUAIFENESIN 100 MG/5ML PO LIQD: 100.0000 mg | ORAL | 0 refills | Status: DC | PRN

## 2016-10-18 MED ORDER — HYDROCODONE-HOMATROPINE 5-1.5 MG/5ML PO SYRP: 5.0000 mL | ORAL_SOLUTION | Freq: Four times a day (QID) | ORAL | 0 refills | Status: DC | PRN

## 2016-10-18 MED ORDER — FLUTICASONE PROPIONATE 50 MCG/ACT NA SUSP: 2.0000 | Freq: Every day | NASAL | 0 refills | Status: DC

## 2016-10-18 MED ORDER — BENZONATATE 100 MG PO CAPS: 100.0000 mg | ORAL_CAPSULE | Freq: Three times a day (TID) | ORAL | 0 refills | Status: DC | PRN

## 2016-10-18 MED ORDER — ONDANSETRON 4 MG PO TBDP: 4.0000 mg | ORAL_TABLET | Freq: Three times a day (TID) | ORAL | 0 refills | Status: DC | PRN

## 2016-10-18 NOTE — Discharge Instructions (Signed)
Even though we did not test you for the flu today your symptoms are most likely due to an upper respiratory infection most likely the flu. As we discussed you do not qualify for Tamiflu today as you are healthy and do not have any other medical problems that would put you at risk for developing complications from the flu.  Please take Tessalon Perles for disruptive daytime cough, Hycodan for disruptive nighttime cough and body aches, Flonase for nasal congestion and runny nose, Robitussin for chest congestion and phlegm and Zofran for nausea as needed. Do not take TheraFlu along with all of these medications. Your symptoms should start to improve in 7 days.  Make sure you drink plenty of water at minimum 2-3 L daily. Good hand hygiene is important to prevent the spread of her symptoms to others around you.  Return to the emergency department if your symptoms do not improve or worsen in 10 days.

## 2016-10-18 NOTE — ED Triage Notes (Signed)
Patient complaining of fever and generalized body aches x 2 days. States he took theraflu at [facility] tonight.

## 2016-10-18 NOTE — ED Provider Notes (Signed)
[facility]-EMERGENCY DEPT Provider Note  CSN: [id number] Arrival date & time: 10/18/16  2043  History   Chief Complaint Chief Complaint  Patient presents with  . Generalized Body Aches  . Fever    HPI Harry Sullivan is a 26 y.o. male with no pmh presents with nasal congestion, clear rhinorrhea, sore throat, non productive cough, chills, fever, body aches, headache, diarrhea x 2 days. Patient states he was nauseous and had one episode of vomiting two days ago when symptoms started but this has since resolved.  Patient has been drinking a lot of water and taking theraflu for symptoms with minimal relief.  No neck pain or rigidity, no blurred vision, no rashes, no abdominal pain, no urinary symptoms, no hemoptysis or recent weight loss. Patient is a smoker.  No known h/o heart disease, kidney insufficiency, asthma/COPD.   HPI  History reviewed. No pertinent past medical history.  There are no active problems to display for this patient.   History reviewed. No pertinent surgical history.     Home Medications    Prior to Admission medications   Medication Sig Start Date End Date Taking? Authorizing Provider  acetaminophen (TYLENOL) 500 MG tablet Take 1,000 mg by mouth every 6 (six) hours as needed. For pain    Historical Provider, MD  benzonatate (TESSALON PERLES) 100 MG capsule Take 1 capsule (100 mg total) by mouth 3 (three) times daily as needed for cough. FOR DISRUPTIVE DAY TIME COUGH 10/18/16   Liberty Handylaudia J Jermel Artley, PA-C  diclofenac (VOLTAREN) 75 MG EC tablet Take 1 tablet (75 mg total) by mouth 2 (two) times daily. 09/16/14   Ivery QualeHobson Bryant, PA-C  fluticasone (FLONASE) 50 MCG/ACT nasal spray Place 2 sprays into both nostrils daily. FOR NASAL CONGESTION 10/18/16   Liberty Handylaudia J Ben Sanz, PA-C  guaiFENesin (ROBITUSSIN) 100 MG/5ML liquid Take 5-10 mLs (100-200 mg total) by mouth every 4 (four) hours as needed for cough. FOR CHEST CONGESTION AND PHLEGM 10/18/16   Liberty Handylaudia J Karter Haire, PA-C    HYDROcodone-acetaminophen (NORCO/VICODIN) 5-325 MG per tablet Take 1 tablet by mouth every 4 (four) hours as needed. 09/16/14   Ivery QualeHobson Bryant, PA-C  HYDROcodone-homatropine (HYCODAN) 5-1.5 MG/5ML syrup Take 5 mLs by mouth every 6 (six) hours as needed for cough. FOR DISRUPTIVE NIGHT TIME COUGH AND BODY ACHES 10/18/16   Liberty Handylaudia J Sofie Schendel, PA-C  ondansetron (ZOFRAN ODT) 4 MG disintegrating tablet Take 1 tablet (4 mg total) by mouth every 8 (eight) hours as needed for nausea or vomiting. 10/18/16   Liberty Handylaudia J Nikeya Maxim, PA-C  promethazine (PHENERGAN) 25 MG tablet Take 1 tablet (25 mg total) by mouth every 6 (six) hours as needed for nausea. 10/02/12   Burgess AmorJulie Idol, PA-C    Family History History reviewed. No pertinent family history.  Social History Social History  Substance Use Topics  . Smoking status: Current Every Day Smoker    Packs/day: 0.50  . Smokeless tobacco: Never Used  . Alcohol use Yes     Comment: occasional     Allergies   Patient has no known allergies.   Review of Systems Review of Systems  Constitutional: Positive for chills and fever.  HENT: Positive for congestion, postnasal drip, rhinorrhea and sore throat.   Eyes: Negative for visual disturbance.  Respiratory: Positive for cough. Negative for chest tightness and shortness of breath.   Cardiovascular: Negative for chest pain.  Gastrointestinal: Positive for diarrhea. Negative for abdominal pain, constipation, nausea and vomiting.  Genitourinary: Negative for decreased urine volume and difficulty urinating.  Musculoskeletal: Positive for myalgias. Negative for arthralgias and joint swelling.  Skin: Negative for rash.  Neurological: Positive for headaches. Negative for dizziness and light-headedness.     Physical Exam Updated Vital Signs BP 143/88 (BP Location: Left Arm)   Pulse 84   Temp 99.8 F (37.7 C) (Oral)   Resp 18   Ht 6\' 4"  (1.93 m)   Wt 68 kg   SpO2 99%   BMI 18.26 kg/m   Physical Exam   Constitutional: He is oriented to person, place, and time. He appears well-developed and well-nourished. No distress.  NAD.  HENT:  Head: Normocephalic and atraumatic.  Right Ear: External ear normal.  Left Ear: External ear normal.  Moist mucous membranes.  Moderate nasal mucosa edema. Oropharynx and tonsils pink erythematous without edema, exudates or lesions.  Uvula midline. No trismus.   Eyes: Conjunctivae and EOM are normal. Pupils are equal, round, and reactive to light. No scleral icterus.  Neck: Normal range of motion. Neck supple. No JVD present. No tracheal deviation present.  No posterior neck pain, full neck ROM, no neck rigidity, no meningeal signs.  Cardiovascular: Normal rate, regular rhythm, normal heart sounds and intact distal pulses.   No murmur heard. Pulmonary/Chest: Effort normal and breath sounds normal. He has no wheezes.  RR within normal limits. SpO2 within normal limits.  Normal breathing effort. Patient speaking in full sentences. No pursed lip breathing. No chest wall retractions. No cyanosis. Chest wall expansion symmetric.  No chest wall tenderness. Lungs CTAB anteriorly and posteriorly without wheezing, rhonchi or crackles.  No egophony.   Abdominal: Soft. He exhibits no distension. There is no tenderness.  Musculoskeletal: Normal range of motion. He exhibits no deformity.  Lymphadenopathy:    He has no cervical adenopathy.  Neurological: He is alert and oriented to person, place, and time.  Skin: Skin is warm and dry. Capillary refill takes less than 2 seconds.  No rash on anterior/posterior trunk, upper/lower extremities, soles of feet or palms of hands.   Psychiatric: He has a normal mood and affect. His behavior is normal. Judgment and thought content normal.  Nursing note and vitals reviewed.   ED Treatments / Results  Labs (all labs ordered are listed, but only abnormal results are displayed) Labs Reviewed - No data to display  EKG  EKG  Interpretation None       Radiology No results found.  Procedures Procedures (including critical care time)  Medications Ordered in ED Medications - No data to display   Initial Impression / Assessment and Plan / ED Course  I have reviewed the triage vital signs and the nursing notes.  Pertinent labs & imaging results that were available during my care of the patient were reviewed by me and considered in my medical decision making (see chart for details).     26 y.o. -year-old male with pertinent pmh of tobacco abuse presents to the ED with URI symptoms, low grade fever and diarrhea  2 days. Symptoms most likely due to a viral URI possible influenza. On my exam patient is nontoxic appearing, speaking in full sentences. No fever, no tachypnea, no tachycardia, normal oxygen saturations. Lungs are clear to auscultation bilaterally without wheezing, rales, egophony. I do not think that a chest x-ray is indicated at this time as vital signs are within normal limits, there are no signs of consolidation on chest exam, there is no hypoxia. I doubt bacterial bronchitis, pneumonia, PE. Influenza testing not indicated at this time as patient  is not high risk for complications and would not be candidate for tamiflu.  I think that symptoms can be treated conservatively at this point. Given reassuring physical exam and vital signs within normal limits patient will be discharged with symptomatic treatment including rest, fluids, Flonase, Tessalon, Robitussin, Hycodan. Strict ED return precautions given. Patient is aware that a viral URI infection may precede the onset of bacterial bronchitis or pneumonia. Patient is aware of red flag symptoms to monitor for that would warrant return to the ED for further reevaluation. Pt ambulated with pulse ox within normal limits prior to discharge.    Final Clinical Impressions(s) / ED Diagnoses   Final diagnoses:  Influenza-like illness    New  Prescriptions Discharge Medication List as of 10/18/2016  9:32 PM    START taking these medications   Details  benzonatate (TESSALON PERLES) 100 MG capsule Take 1 capsule (100 mg total) by mouth 3 (three) times daily as needed for cough. FOR DISRUPTIVE DAY TIME COUGH, Starting Tue 10/18/2016, Print    fluticasone (FLONASE) 50 MCG/ACT nasal spray Place 2 sprays into both nostrils daily. FOR NASAL CONGESTION, Starting Tue 10/18/2016, Print    guaiFENesin (ROBITUSSIN) 100 MG/5ML liquid Take 5-10 mLs (100-200 mg total) by mouth every 4 (four) hours as needed for cough. FOR CHEST CONGESTION AND PHLEGM, Starting Tue 10/18/2016, Print    HYDROcodone-homatropine (HYCODAN) 5-1.5 MG/5ML syrup Take 5 mLs by mouth every 6 (six) hours as needed for cough. FOR DISRUPTIVE NIGHT TIME COUGH AND BODY ACHES, Starting Tue 10/18/2016, Print    ondansetron (ZOFRAN ODT) 4 MG disintegrating tablet Take 1 tablet (4 mg total) by mouth every 8 (eight) hours as needed for nausea or vomiting., Starting Tue 10/18/2016, Print         Liberty Handy, PA-C 10/18/16 2223    Nira Conn, MD 10/19/16 [phone]

## 2018-11-21 ENCOUNTER — Emergency Department (HOSPITAL_COMMUNITY)
Admission: EM | Admit: 2018-11-21 | Discharge: 2018-11-21 | Disposition: A | Discharge status: Home / Self Care | Payer: Self-pay | Attending: Emergency Medicine | Admitting: Emergency Medicine

## 2018-11-21 ENCOUNTER — Encounter (HOSPITAL_COMMUNITY): Payer: Self-pay | Admitting: Emergency Medicine

## 2018-11-21 ENCOUNTER — Other Ambulatory Visit: Payer: Self-pay

## 2018-11-21 DIAGNOSIS — J029 Acute pharyngitis, unspecified: Secondary | ICD-10-CM | POA: Insufficient documentation

## 2018-11-21 LAB — GROUP A STREP BY PCR: GROUP A STREP BY PCR: NOT DETECTED

## 2018-11-21 NOTE — ED Triage Notes (Signed)
Onset 3 days ago headache and sore throat, was told at work he need to come to ED to be tested for virus

## 2018-11-21 NOTE — Discharge Instructions (Addendum)
Return if any problems.

## 2018-11-21 NOTE — ED Provider Notes (Signed)
[facility] EMERGENCY DEPARTMENT Provider Note   CSN: [id number] Arrival date & time: 11/21/18  1339    History   Chief Complaint Chief Complaint  Patient presents with  . Sore Throat    HPI Harry Sullivan is a 28 y.o. male.     The history is provided by the patient. No language interpreter was used.  Sore Throat  This is a new problem. The current episode started more than 2 days ago. The problem occurs constantly. The problem has been gradually worsening. Nothing aggravates the symptoms. Nothing relieves the symptoms. He has tried nothing for the symptoms.  Pt reports he has had a sore throat.  Pt sent from work because of coronavirus concerns.  Pt has not traveled, no fever, no exposures  History reviewed. No pertinent past medical history.  There are no active problems to display for this patient.   History reviewed. No pertinent surgical history.      Home Medications    Prior to Admission medications   Medication Sig Start Date End Date Taking? Authorizing Provider  acetaminophen (TYLENOL) 500 MG tablet Take 1,000 mg by mouth every 6 (six) hours as needed. For pain    [provider]  benzonatate (TESSALON PERLES) 100 MG capsule Take 1 capsule (100 mg total) by mouth 3 (three) times daily as needed for cough. FOR DISRUPTIVE DAY TIME COUGH 10/18/16   Liberty Handy, PA-C  diclofenac (VOLTAREN) 75 MG EC tablet Take 1 tablet (75 mg total) by mouth 2 (two) times daily. 09/16/14   Ivery Quale, PA-C  fluticasone (FLONASE) 50 MCG/ACT nasal spray Place 2 sprays into both nostrils daily. FOR NASAL CONGESTION 10/18/16   Liberty Handy, PA-C  guaiFENesin (ROBITUSSIN) 100 MG/5ML liquid Take 5-10 mLs (100-200 mg total) by mouth every 4 (four) hours as needed for cough. FOR CHEST CONGESTION AND PHLEGM 10/18/16   Liberty Handy, PA-C  HYDROcodone-acetaminophen (NORCO/VICODIN) 5-325 MG per tablet Take 1 tablet by mouth every 4 (four) hours as needed. 09/16/14    Ivery Quale, PA-C  HYDROcodone-homatropine [facility]) 5-1.5 MG/5ML syrup Take 5 mLs by mouth every 6 (six) hours as needed for cough. FOR DISRUPTIVE NIGHT TIME COUGH AND BODY ACHES 10/18/16   Liberty Handy, PA-C  ondansetron (ZOFRAN ODT) 4 MG disintegrating tablet Take 1 tablet (4 mg total) by mouth every 8 (eight) hours as needed for nausea or vomiting. 10/18/16   Liberty Handy, PA-C  promethazine (PHENERGAN) 25 MG tablet Take 1 tablet (25 mg total) by mouth every 6 (six) hours as needed for nausea. 10/02/12   Burgess Amor, PA-C    Family History No family history on file.  Social History Social History   Tobacco Use  . Smoking status: Current Every Day Smoker    Packs/day: 0.50  . Smokeless tobacco: Never Used  Substance Use Topics  . Alcohol use: Yes    Alcohol/week: 1.0 standard drinks    Types: 1 Shots of liquor per week    Comment: daily  . Drug use: Yes    Types: Marijuana    Comment: 09/16/13     Allergies   Patient has no known allergies.   Review of Systems Review of Systems  All other systems reviewed and are negative.    Physical Exam Updated Vital Signs Ht 6\' 1"  (1.854 m)   Wt 67.1 kg   BMI 19.53 kg/m   Physical Exam Vitals signs and nursing note reviewed.  HENT:     Head:  Normocephalic.     Right Ear: Tympanic membrane normal.     Left Ear: Tympanic membrane normal.     Mouth/Throat:     Mouth: Mucous membranes are moist.  Eyes:     Conjunctiva/sclera: Conjunctivae normal.  Neck:     Musculoskeletal: Normal range of motion.  Cardiovascular:     Rate and Rhythm: Normal rate.     Heart sounds: Normal heart sounds.  Abdominal:     Palpations: Abdomen is soft.  Skin:    General: Skin is warm.  Neurological:     General: No focal deficit present.     Mental Status: He is alert.  Psychiatric:        Mood and Affect: Mood normal.      ED Treatments / Results  Labs (all labs ordered are listed, but only abnormal results are  displayed) Labs Reviewed  GROUP A STREP BY PCR  Strep negative   EKG None  Radiology No results found.  Procedures Procedures (including critical care time)  Medications Ordered in ED Medications - No data to display   Initial Impression / Assessment and Plan / ED Course  I have reviewed the triage vital signs and the nursing notes.  Pertinent labs & imaging results that were available during my care of the patient were reviewed by me and considered in my medical decision making (see chart for details).        MDM Pt advised he does not need coronavirus testing at this time.  Pt advised tylenol if needed.  Return if any problems.   Final Clinical Impressions(s) / ED Diagnoses   Final diagnoses:  Pharyngitis, unspecified etiology    ED Discharge Orders    None    An After Visit Summary was printed and given to the patient.    Osie Cheeks 11/21/18 1535    Benjiman Core, MD 11/22/18 [phone]

## 2018-12-11 ENCOUNTER — Other Ambulatory Visit: Payer: Self-pay

## 2018-12-11 ENCOUNTER — Emergency Department (HOSPITAL_COMMUNITY)
Admission: EM | Admit: 2018-12-11 | Discharge: 2018-12-11 | Disposition: A | Discharge status: Home / Self Care | Payer: Self-pay | Attending: Emergency Medicine | Admitting: Emergency Medicine

## 2018-12-11 ENCOUNTER — Encounter (HOSPITAL_COMMUNITY): Payer: Self-pay | Admitting: *Deleted

## 2018-12-11 DIAGNOSIS — J029 Acute pharyngitis, unspecified: Secondary | ICD-10-CM | POA: Insufficient documentation

## 2018-12-11 DIAGNOSIS — R0981 Nasal congestion: Secondary | ICD-10-CM | POA: Insufficient documentation

## 2018-12-11 DIAGNOSIS — F17210 Nicotine dependence, cigarettes, uncomplicated: Secondary | ICD-10-CM | POA: Insufficient documentation

## 2018-12-11 DIAGNOSIS — F1210 Cannabis abuse, uncomplicated: Secondary | ICD-10-CM | POA: Insufficient documentation

## 2018-12-11 DIAGNOSIS — R05 Cough: Secondary | ICD-10-CM | POA: Insufficient documentation

## 2018-12-11 LAB — GROUP A STREP BY PCR: Group A Strep by PCR: NOT DETECTED

## 2018-12-11 MED ORDER — IBUPROFEN 600 MG PO TABS: 600.0000 mg | ORAL_TABLET | Freq: Four times a day (QID) | ORAL | 0 refills | Status: DC | PRN

## 2018-12-11 MED ORDER — PENICILLIN G BENZATHINE 1200000 UNIT/2ML IM SUSP
1.2000 10*6.[IU] | Freq: Once | INTRAMUSCULAR | Status: AC
  Administered 2018-12-11: 1.2 10*6.[IU] via INTRAMUSCULAR
  Filled 2018-12-11: qty 2

## 2018-12-11 NOTE — ED Provider Notes (Signed)
[facility] EMERGENCY DEPARTMENT Provider Note   CSN: [id number] Arrival date & time: 12/11/18  0033    History   Chief Complaint Chief Complaint  Patient presents with  . Sore Throat    HPI Harry Sullivan is a 28 y.o. male.     Patient reports 2 to 3 days of severe sore throat and pain with swallowing.  States both sides hurt equally.  He has had a mild cough and congestion but no documented fever.  He is been using Halls lozenges at home and Alka-Seltzer without relief.  Reports this pain is different from when he was seen in the ED on March 18 with a sore throat then.  That sore throat resolved.  He denies any body aches or chills.  No fever.  No chest pain or shortness of breath.  No abdominal pain, nausea or vomiting.  No pain with urination or blood in the urine.  No recent travel or known sick contacts.  No known exposure to coronavirus.  The history is provided by the patient.  Sore Throat  Pertinent negatives include no chest pain, no abdominal pain and no headaches.    History reviewed. No pertinent past medical history.  There are no active problems to display for this patient.   History reviewed. No pertinent surgical history.      Home Medications    Prior to Admission medications   Medication Sig Start Date End Date Taking? Authorizing Provider  acetaminophen (TYLENOL) 500 MG tablet Take 1,000 mg by mouth every 6 (six) hours as needed. For pain    [provider]  benzonatate (TESSALON PERLES) 100 MG capsule Take 1 capsule (100 mg total) by mouth 3 (three) times daily as needed for cough. FOR DISRUPTIVE DAY TIME COUGH 10/18/16   Liberty Handy, PA-C  diclofenac (VOLTAREN) 75 MG EC tablet Take 1 tablet (75 mg total) by mouth 2 (two) times daily. 09/16/14   Ivery Quale, PA-C  fluticasone (FLONASE) 50 MCG/ACT nasal spray Place 2 sprays into both nostrils daily. FOR NASAL CONGESTION 10/18/16   Liberty Handy, PA-C  guaiFENesin (ROBITUSSIN)  100 MG/5ML liquid Take 5-10 mLs (100-200 mg total) by mouth every 4 (four) hours as needed for cough. FOR CHEST CONGESTION AND PHLEGM 10/18/16   Liberty Handy, PA-C  HYDROcodone-acetaminophen (NORCO/VICODIN) 5-325 MG per tablet Take 1 tablet by mouth every 4 (four) hours as needed. 09/16/14   Ivery Quale, PA-C  HYDROcodone-homatropine [facility]) 5-1.5 MG/5ML syrup Take 5 mLs by mouth every 6 (six) hours as needed for cough. FOR DISRUPTIVE NIGHT TIME COUGH AND BODY ACHES 10/18/16   Liberty Handy, PA-C  ondansetron (ZOFRAN ODT) 4 MG disintegrating tablet Take 1 tablet (4 mg total) by mouth every 8 (eight) hours as needed for nausea or vomiting. 10/18/16   Liberty Handy, PA-C  promethazine (PHENERGAN) 25 MG tablet Take 1 tablet (25 mg total) by mouth every 6 (six) hours as needed for nausea. 10/02/12   Burgess Amor, PA-C    Family History History reviewed. No pertinent family history.  Social History Social History   Tobacco Use  . Smoking status: Current Every Day Smoker    Packs/day: 0.50  . Smokeless tobacco: Never Used  Substance Use Topics  . Alcohol use: Yes    Alcohol/week: 1.0 standard drinks    Types: 1 Shots of liquor per week    Comment: daily  . Drug use: Yes    Types: Marijuana    Comment: 09/16/13  Allergies   Patient has no known allergies.   Review of Systems Review of Systems  Constitutional: Negative for activity change, appetite change and fever.  HENT: Positive for sore throat and trouble swallowing. Negative for congestion, drooling and rhinorrhea.   Eyes: Negative for visual disturbance.  Respiratory: Negative for cough and chest tightness.   Cardiovascular: Negative for chest pain.  Gastrointestinal: Negative for abdominal pain, nausea and vomiting.  Genitourinary: Negative for dysuria and hematuria.  Musculoskeletal: Negative for arthralgias and myalgias.  Skin: Negative for rash.  Neurological: Negative for dizziness, weakness and  headaches.   all other systems are negative except as noted in the HPI and PMH.     Physical Exam Updated Vital Signs BP 125/82 (BP Location: Left Arm)   Pulse 81   Temp 98.4 F (36.9 C) (Oral)   Resp 18   Ht 6\' 1"  (1.854 m)   Wt 67.1 kg   SpO2 99%   BMI 19.53 kg/m   Physical Exam Nursing note reviewed.  Constitutional:      General: He is not in acute distress.    Appearance: Normal appearance. He is well-developed and normal weight. He is not ill-appearing or toxic-appearing.  HENT:     Head: Normocephalic and atraumatic.     Right Ear: Tympanic membrane normal.     Left Ear: Tympanic membrane normal.     Mouth/Throat:     Mouth: Mucous membranes are moist.     Pharynx: Posterior oropharyngeal erythema present. No oropharyngeal exudate.     Comments: Erythematous oropharynx.  No asymmetry.  Uvula midline.  No tongue swelling.  No floor mouth swelling.  No exudates seen Eyes:     Conjunctiva/sclera: Conjunctivae normal.     Pupils: Pupils are equal, round, and reactive to light.  Neck:     Musculoskeletal: Normal range of motion and neck supple.     Comments: No meningismus. Cardiovascular:     Rate and Rhythm: Normal rate and regular rhythm.     Heart sounds: Normal heart sounds. No murmur.  Pulmonary:     Effort: Pulmonary effort is normal. No respiratory distress.     Breath sounds: Normal breath sounds.  Abdominal:     Palpations: Abdomen is soft.     Tenderness: There is no abdominal tenderness. There is no guarding or rebound.  Musculoskeletal: Normal range of motion.        General: No tenderness.  Lymphadenopathy:     Cervical: Cervical adenopathy present.  Skin:    General: Skin is warm.     Capillary Refill: Capillary refill takes less than 2 seconds.  Neurological:     General: No focal deficit present.     Mental Status: He is alert and oriented to person, place, and time. Mental status is at baseline.     Cranial Nerves: No cranial nerve deficit.      Motor: No abnormal muscle tone.     Coordination: Coordination normal.     Comments: No ataxia on finger to nose bilaterally. No pronator drift. 5/5 strength throughout. CN 2-12 intact.Equal grip strength. Sensation intact.   Psychiatric:        Behavior: Behavior normal.      ED Treatments / Results  Labs (all labs ordered are listed, but only abnormal results are displayed) Labs Reviewed  GROUP A STREP BY PCR    EKG None  Radiology No results found.  Procedures Procedures (including critical care time)  Medications Ordered in ED Medications -  No data to display   Initial Impression / Assessment and Plan / ED Course  I have reviewed the triage vital signs and the nursing notes.  Pertinent labs & imaging results that were available during my care of the patient were reviewed by me and considered in my medical decision making (see chart for details).       Several days of sore throat and pain with swallowing.  No difficulty breathing.  No evidence of peritonsillar abscess.  Floor mouth is soft. Rapid strep is negative.  However given patient's diffuse erythema and lymphadenopathy will empirically treat with Bicillin.  No other symptoms to suggest coronavirus exposure at this time but cannot be ruled out completely.  Patient counseled to quarantine at home, p.o. fluids, antipyretics.  Return precautions discussed.  Bertran F Biehler was evaluated in Emergency Department on 12/11/2018 for the symptoms described in the history of present illness. He was evaluated in the context of the global COVID-19 pandemic, which necessitated consideration that the patient might be at risk for infection with the SARS-CoV-2 virus that causes COVID-19. Institutional protocols and algorithms that pertain to the evaluation of patients at risk for COVID-19 are in a state of rapid change based on information released by regulatory bodies including the CDC and federal and state  organizations. These policies and algorithms were followed during the patient's care in the ED.   Final Clinical Impressions(s) / ED Diagnoses   Final diagnoses:  Acute pharyngitis, unspecified etiology    ED Discharge Orders    None       Violet Seabury, Jeannett Senior, MD 12/11/18 [phone]

## 2018-12-11 NOTE — Discharge Instructions (Signed)
Your treated for possible strep throat.  Keep yourself hydrated.  Use Tylenol or ibuprofen as needed for aches and fevers.  Return to the ED if not eating, not drinking, difficulty breathing, difficulty swallowing or other concerns.  As we discussed, coronavirus cannot be completely excluded and you should isolate yourself at home if you are feeling better.     Person Under Monitoring Name: Harry Sullivan  Location: [address] [place] [address]   Infection Prevention Recommendations for Individuals Confirmed to have, or Being Evaluated for, 2019 Novel Coronavirus (COVID-19) Infection Who Receive Care at Home  Individuals who are confirmed to have, or are being evaluated for, COVID-19 should follow the prevention steps below until a healthcare provider or local or state health department says they can return to normal activities.  Stay home except to get medical care You should restrict activities outside your home, except for getting medical care. Do not go to work, school, or public areas, and do not use public transportation or taxis.  Call ahead before visiting your doctor Before your medical appointment, call the healthcare provider and tell them that you have, or are being evaluated for, COVID-19 infection. This will help the healthcare providers office take steps to keep other people from getting infected. Ask your healthcare provider to call the local or state health department.  Monitor your symptoms Seek prompt medical attention if your illness is worsening (e.g., difficulty breathing). Before going to your medical appointment, call the healthcare provider and tell them that you have, or are being evaluated for, COVID-19 infection. Ask your healthcare provider to call the local or state health department.  Wear a facemask You should wear a facemask that covers your nose and mouth when you are in the same room with other people and when you visit a healthcare  provider. People who live with or visit you should also wear a facemask while they are in the same room with you.  Separate yourself from other people in your home As much as possible, you should stay in a different room from other people in your home. Also, you should use a separate bathroom, if available.  Avoid sharing household items You should not share dishes, drinking glasses, cups, eating utensils, towels, bedding, or other items with other people in your home. After using these items, you should wash them thoroughly with soap and water.  Cover your coughs and sneezes Cover your mouth and nose with a tissue when you cough or sneeze, or you can cough or sneeze into your sleeve. Throw used tissues in a lined trash can, and immediately wash your hands with soap and water for at least 20 seconds or use an alcohol-based hand rub.  Wash your [employer] your hands often and thoroughly with soap and water for at least 20 seconds. You can use an alcohol-based hand sanitizer if soap and water are not available and if your hands are not visibly dirty. Avoid touching your eyes, nose, and mouth with unwashed hands.   Prevention Steps for Caregivers and Household Members of Individuals Confirmed to have, or Being Evaluated for, COVID-19 Infection Being Cared for in the Home  If you live with, or provide care at home for, a person confirmed to have, or being evaluated for, COVID-19 infection please follow these guidelines to prevent infection:  Follow healthcare providers instructions Make sure that you understand and can help the patient follow any healthcare provider instructions for all care.  Provide for the patients  basic needs You should help the patient with basic needs in the home and provide support for getting groceries, prescriptions, and other personal needs.  Monitor the patients symptoms If they are getting sicker, call his or her medical provider and tell them that the  patient has, or is being evaluated for, COVID-19 infection. This will help the healthcare providers office take steps to keep other people from getting infected. Ask the healthcare provider to call the local or state health department.  Limit the number of people who have contact with the patient If possible, have only one caregiver for the patient. Other household members should stay in another home or place of residence. If this is not possible, they should stay in another room, or be separated from the patient as much as possible. Use a separate bathroom, if available. Restrict visitors who do not have an essential need to be in the home.  Keep older adults, very young children, and other sick people away from the patient Keep older adults, very young children, and those who have compromised immune systems or chronic health conditions away from the patient. This includes people with chronic heart, lung, or kidney conditions, diabetes, and cancer.  Ensure good ventilation Make sure that shared spaces in the home have good air flow, such as from an air conditioner or an opened window, weather permitting.  Wash your hands often Wash your hands often and thoroughly with soap and water for at least 20 seconds. You can use an alcohol based hand sanitizer if soap and water are not available and if your hands are not visibly dirty. Avoid touching your eyes, nose, and mouth with unwashed hands. Use disposable paper towels to dry your hands. If not available, use dedicated cloth towels and replace them when they become wet.  Wear a facemask and gloves Wear a disposable facemask at all times in the room and gloves when you touch or have contact with the patients blood, body fluids, and/or secretions or excretions, such as sweat, saliva, sputum, nasal mucus, vomit, urine, or feces.  Ensure the mask fits over your nose and mouth tightly, and do not touch it during use. Throw out disposable facemasks  and gloves after using them. Do not reuse. Wash your hands immediately after removing your facemask and gloves. If your personal clothing becomes contaminated, carefully remove clothing and launder. Wash your hands after handling contaminated clothing. Place all used disposable facemasks, gloves, and other waste in a lined container before disposing them with other household waste. Remove gloves and wash your hands immediately after handling these items.  Do not share dishes, glasses, or other household items with the patient Avoid sharing household items. You should not share dishes, drinking glasses, cups, eating utensils, towels, bedding, or other items with a patient who is confirmed to have, or being evaluated for, COVID-19 infection. After the person uses these items, you should wash them thoroughly with soap and water.  Wash laundry thoroughly Immediately remove and wash clothes or bedding that have blood, body fluids, and/or secretions or excretions, such as sweat, saliva, sputum, nasal mucus, vomit, urine, or feces, on them. Wear gloves when handling laundry from the patient. Read and follow directions on labels of laundry or clothing items and detergent. In general, wash and dry with the warmest temperatures recommended on the label.  Clean all areas the individual has used often Clean all touchable surfaces, such as counters, tabletops, doorknobs, bathroom fixtures, toilets, phones, keyboards, tablets, and bedside tables, every  day. Also, clean any surfaces that may have blood, body fluids, and/or secretions or excretions on them. Wear gloves when cleaning surfaces the patient has come in contact with. Use a diluted bleach solution (e.g., dilute bleach with 1 part bleach and 10 parts water) or a household disinfectant with a label that says EPA-registered for coronaviruses. To make a bleach solution at home, add 1 tablespoon of bleach to 1 quart (4 cups) of water. For a larger supply,  add  cup of bleach to 1 gallon (16 cups) of water. Read labels of cleaning products and follow recommendations provided on product labels. Labels contain instructions for safe and effective use of the cleaning product including precautions you should take when applying the product, such as wearing gloves or eye protection and making sure you have good ventilation during use of the product. Remove gloves and wash hands immediately after cleaning.  Monitor yourself for signs and symptoms of illness Caregivers and household members are considered close contacts, should monitor their health, and will be asked to limit movement outside of the home to the extent possible. Follow the monitoring steps for close contacts listed on the symptom monitoring form.   ? If you have additional questions, contact your local health department or call the epidemiologist on call at [phone] (available 24/7). ? This guidance is subject to change. For the most up-to-date guidance from [facility], please refer to their website: TripMetro.huhttps://www.cdc.gov/coronavirus/2019-ncov/hcp/guidance-prevent-spread.html

## 2018-12-11 NOTE — ED Triage Notes (Signed)
Pt c/o sore throat x 2 days; says its very painful when swallowing

## 2019-03-07 ENCOUNTER — Other Ambulatory Visit: Payer: Self-pay

## 2019-03-07 DIAGNOSIS — Z20822 Contact with and (suspected) exposure to covid-19: Secondary | ICD-10-CM

## 2019-03-14 LAB — NOVEL CORONAVIRUS, NAA: SARS-CoV-2, NAA: NOT DETECTED

## 2019-03-20 ENCOUNTER — Other Ambulatory Visit: Payer: Self-pay

## 2019-03-20 ENCOUNTER — Emergency Department (HOSPITAL_COMMUNITY)
Admission: EM | Admit: 2019-03-20 | Discharge: 2019-03-20 | Disposition: A | Discharge status: Home / Self Care | Payer: Self-pay | Attending: Emergency Medicine | Admitting: Emergency Medicine

## 2019-03-20 DIAGNOSIS — Z79899 Other long term (current) drug therapy: Secondary | ICD-10-CM | POA: Insufficient documentation

## 2019-03-20 DIAGNOSIS — F17200 Nicotine dependence, unspecified, uncomplicated: Secondary | ICD-10-CM | POA: Insufficient documentation

## 2019-03-20 DIAGNOSIS — Z0000 Encounter for general adult medical examination without abnormal findings: Secondary | ICD-10-CM | POA: Insufficient documentation

## 2019-03-20 NOTE — ED Triage Notes (Signed)
Patient states that he has had abdominal pain since Sunday. Patient states he is not having any symptoms today. Patient states his place of work will not let him come back until he gets a doctor's note.

## 2019-03-20 NOTE — Discharge Instructions (Signed)
Please see the information and instructions below regarding your visit.  Your diagnoses today include:  1. Encounter for health check     Tests performed today include: See side panel of your discharge paperwork for testing performed today. Vital signs are listed at the bottom of these instructions.   Medications prescribed:    Take any prescribed medications only as prescribed, and any over the counter medications only as directed on the packaging.  Home care instructions:  Please follow any educational materials contained in this packet.   Follow-up instructions:   Return instructions:  Please return to the Emergency Department if you experience worsening symptoms.  Please return if you have any other emergent concerns.  Additional Information:   Your vital signs today were: BP 133/88 (BP Location: Right Arm)    Pulse 70    Temp 98.4 F (36.9 C) (Oral)    Resp 18    Ht 6\' 1"  (1.854 m)    Wt 68.9 kg    SpO2 97%    BMI 20.05 kg/m  If your blood pressure (BP) was elevated on multiple readings during this visit above 130 for the top number or above 80 for the bottom number, please have this repeated by your primary care provider within one month. --------------  Thank you for allowing [place] to participate in your care today.

## 2019-03-21 NOTE — ED Provider Notes (Signed)
[facility] EMERGENCY DEPARTMENT Provider Note   CSN: [id number] Arrival date & time: 03/20/19  2147     History   Chief Complaint Chief Complaint  Patient presents with  . Letter for School/Work    HPI Harry Sullivan is a 28 y.o. male.     HPI  Patient is a 28 year old male with no significant past medical history presenting for need for note for work.  Patient reports that about a week ago he had a "upset stomach".  He never had any nausea, vomiting or diarrhea but did have some cramping in his abdomen.  His work is requesting that he have a note allowing him to go back to work.  He denies any symptoms currently.  He was tested for COVID-19 routinely on 03-07-2019 to start work and it was negative.  No past medical history on file.  There are no active problems to display for this patient.   No past surgical history on file.      Home Medications    Prior to Admission medications   Medication Sig Start Date End Date Taking? Authorizing Provider  acetaminophen (TYLENOL) 500 MG tablet Take 1,000 mg by mouth every 6 (six) hours as needed. For pain    [provider]  benzonatate (TESSALON PERLES) 100 MG capsule Take 1 capsule (100 mg total) by mouth 3 (three) times daily as needed for cough. FOR DISRUPTIVE DAY TIME COUGH 10/18/16   Liberty HandyGibbons, Claudia J, PA-C  diclofenac (VOLTAREN) 75 MG EC tablet Take 1 tablet (75 mg total) by mouth 2 (two) times daily. 09/16/14   Ivery QualeBryant, Hobson, PA-C  fluticasone (FLONASE) 50 MCG/ACT nasal spray Place 2 sprays into both nostrils daily. FOR NASAL CONGESTION 10/18/16   Liberty HandyGibbons, Claudia J, PA-C  guaiFENesin (ROBITUSSIN) 100 MG/5ML liquid Take 5-10 mLs (100-200 mg total) by mouth every 4 (four) hours as needed for cough. FOR CHEST CONGESTION AND PHLEGM 10/18/16   Liberty HandyGibbons, Claudia J, PA-C  HYDROcodone-acetaminophen (NORCO/VICODIN) 5-325 MG per tablet Take 1 tablet by mouth every 4 (four) hours as needed. 09/16/14   Ivery QualeBryant, Hobson, PA-C   HYDROcodone-homatropine [facility](HYCODAN) 5-1.5 MG/5ML syrup Take 5 mLs by mouth every 6 (six) hours as needed for cough. FOR DISRUPTIVE NIGHT TIME COUGH AND BODY ACHES 10/18/16   Liberty HandyGibbons, Claudia J, PA-C  ibuprofen (ADVIL,MOTRIN) 600 MG tablet Take 1 tablet (600 mg total) by mouth every 6 (six) hours as needed. 12/11/18   Rancour, Jeannett SeniorStephen, MD  ondansetron (ZOFRAN ODT) 4 MG disintegrating tablet Take 1 tablet (4 mg total) by mouth every 8 (eight) hours as needed for nausea or vomiting. 10/18/16   Liberty HandyGibbons, Claudia J, PA-C  promethazine (PHENERGAN) 25 MG tablet Take 1 tablet (25 mg total) by mouth every 6 (six) hours as needed for nausea. 10/02/12   Burgess AmorIdol, Julie, PA-C    Family History No family history on file.  Social History Social History   Tobacco Use  . Smoking status: Current Every Day Smoker    Packs/day: 0.50  . Smokeless tobacco: Never Used  Substance Use Topics  . Alcohol use: Yes    Alcohol/week: 1.0 standard drinks    Types: 1 Shots of liquor per week    Comment: daily  . Drug use: Yes    Types: Marijuana    Comment: 09/16/13     Allergies   Patient has no known allergies.   Review of Systems Review of Systems  Constitutional: Negative for chills and fever.  HENT: Negative for congestion, rhinorrhea and  sore throat.   Respiratory: Negative for chest tightness and shortness of breath.   Cardiovascular: Negative for chest pain.  Gastrointestinal: Negative for abdominal pain, nausea and vomiting.     Physical Exam Updated Vital Signs BP 133/88 (BP Location: Right Arm)   Pulse 70   Temp 98.4 F (36.9 C) (Oral)   Resp 18   Ht 6\' 1"  (1.854 m)   Wt 68.9 kg   SpO2 97%   BMI 20.05 kg/m   Physical Exam Vitals signs and nursing note reviewed.  Constitutional:      General: He is not in acute distress.    Appearance: He is well-developed. He is not diaphoretic.     Comments: Sitting comfortably in bed.  HENT:     Head: Normocephalic and atraumatic.  Eyes:     General:         Right eye: No discharge.        Left eye: No discharge.     Conjunctiva/sclera: Conjunctivae normal.     Comments: EOMs normal to gross examination.  Neck:     Musculoskeletal: Normal range of motion.  Cardiovascular:     Rate and Rhythm: Normal rate and regular rhythm.     Comments: Intact, 2+ radial pulse. Pulmonary:     Effort: Pulmonary effort is normal.     Breath sounds: Normal breath sounds. No wheezing or rales.  Abdominal:     General: There is no distension.  Musculoskeletal: Normal range of motion.  Skin:    General: Skin is warm and dry.  Neurological:     Mental Status: He is alert.     Comments: Cranial nerves intact to gross observation. Patient moves extremities without difficulty.  Psychiatric:        Behavior: Behavior normal.        Thought Content: Thought content normal.        Judgment: Judgment normal.      ED Treatments / Results  Labs (all labs ordered are listed, but only abnormal results are displayed) Labs Reviewed - No data to display  EKG None  Radiology No results found.  Procedures Procedures (including critical care time)  Medications Ordered in ED Medications - No data to display   Initial Impression / Assessment and Plan / ED Course  I have reviewed the triage vital signs and the nursing notes.  Pertinent labs & imaging results that were available during my care of the patient were reviewed by me and considered in my medical decision making (see chart for details).        This is a well-appearing 28 year old male with no significant past medical history presenting for medical clearance to go to work after he had some abdominal cramping a week ago.  He has no symptoms at all currently.  No clinical reason to assess for COVID-19.  Low suspicion for this pathology.  Patient is given full clearance to go back to work.  Return precautions given for any new or worsening symptoms.  Final Clinical Impressions(s) / ED  Diagnoses   Final diagnoses:  Encounter for health check    ED Discharge Orders    None       Tamala Julian 03/21/19 0106    Fredia Sorrow, MD 04/04/19 1620

## 2019-04-03 ENCOUNTER — Encounter (HOSPITAL_COMMUNITY): Payer: Self-pay | Admitting: *Deleted

## 2019-04-03 ENCOUNTER — Emergency Department (HOSPITAL_COMMUNITY)
Admission: EM | Admit: 2019-04-03 | Discharge: 2019-04-03 | Disposition: A | Discharge status: Home / Self Care | Payer: HRSA Program | Attending: Emergency Medicine | Admitting: Emergency Medicine

## 2019-04-03 ENCOUNTER — Other Ambulatory Visit: Payer: Self-pay

## 2019-04-03 DIAGNOSIS — Z20828 Contact with and (suspected) exposure to other viral communicable diseases: Secondary | ICD-10-CM | POA: Diagnosis not present

## 2019-04-03 DIAGNOSIS — Z711 Person with feared health complaint in whom no diagnosis is made: Secondary | ICD-10-CM

## 2019-04-03 DIAGNOSIS — F17210 Nicotine dependence, cigarettes, uncomplicated: Secondary | ICD-10-CM | POA: Diagnosis not present

## 2019-04-03 NOTE — Discharge Instructions (Addendum)
You have been tested for Covid 19 today. This test will take 1-2 days to result.  You will need to stay out of work until your test is resulted and is negative, as discussed.   You should plan self quarantine until your test is negative.     Person Under Monitoring Name: Harry Sullivan  Location: [address]  [place] [address]   Infection Prevention Recommendations for Individuals Confirmed to have, or Being Evaluated for, 2019 Novel Coronavirus (COVID-19) Infection Who Receive Care at Home  Individuals who are confirmed to have, or are being evaluated for, COVID-19 should follow the prevention steps below until a healthcare provider or local or state health department says they can return to normal activities.  Stay home except to get medical care You should restrict activities outside your home, except for getting medical care. Do not go to work, school, or public areas, and do not use public transportation or taxis.  Call ahead before visiting your doctor Before your medical appointment, call the healthcare provider and tell them that you have, or are being evaluated for, COVID-19 infection. This will help the healthcare providers office take steps to keep other people from getting infected. Ask your healthcare provider to call the local or state health department.  Monitor your symptoms Seek prompt medical attention if your illness is worsening (e.g., difficulty breathing). Before going to your medical appointment, call the healthcare provider and tell them that you have, or are being evaluated for, COVID-19 infection. Ask your healthcare provider to call the local or state health department.  Wear a facemask You should wear a facemask that covers your nose and mouth when you are in the same room with other people and when you visit a healthcare provider. People who live with or visit you should also wear a facemask while they are in the same room with  you.  Separate yourself from other people in your home As much as possible, you should stay in a different room from other people in your home. Also, you should use a separate bathroom, if available.  Avoid sharing household items You should not share dishes, drinking glasses, cups, eating utensils, towels, bedding, or other items with other people in your home. After using these items, you should wash them thoroughly with soap and water.  Cover your coughs and sneezes Cover your mouth and nose with a tissue when you cough or sneeze, or you can cough or sneeze into your sleeve. Throw used tissues in a lined trash can, and immediately wash your hands with soap and water for at least 20 seconds or use an alcohol-based hand rub.  Wash your [facility] your hands often and thoroughly with soap and water for at least 20 seconds. You can use an alcohol-based hand sanitizer if soap and water are not available and if your hands are not visibly dirty. Avoid touching your eyes, nose, and mouth with unwashed hands.   Prevention Steps for Caregivers and Household Members of Individuals Confirmed to have, or Being Evaluated for, COVID-19 Infection Being Cared for in the Home  If you live with, or provide care at home for, a person confirmed to have, or being evaluated for, COVID-19 infection please follow these guidelines to prevent infection:  Follow healthcare providers instructions Make sure that you understand and can help the patient follow any healthcare provider instructions for all care.  Provide for the patients basic needs You should help the patient with basic needs in the  home and provide support for getting groceries, prescriptions, and other personal needs.  Monitor the patients symptoms If they are getting sicker, call his or her medical provider and tell them that the patient has, or is being evaluated for, COVID-19 infection. This will help the healthcare providers office  take steps to keep other people from getting infected. Ask the healthcare provider to call the local or state health department.  Limit the number of people who have contact with the patient If possible, have only one caregiver for the patient. Other household members should stay in another home or place of residence. If this is not possible, they should stay in another room, or be separated from the patient as much as possible. Use a separate bathroom, if available. Restrict visitors who do not have an essential need to be in the home.  Keep older adults, very young children, and other sick people away from the patient Keep older adults, very young children, and those who have compromised immune systems or chronic health conditions away from the patient. This includes people with chronic heart, lung, or kidney conditions, diabetes, and cancer.  Ensure good ventilation Make sure that shared spaces in the home have good air flow, such as from an air conditioner or an opened window, weather permitting.  Wash your hands often Wash your hands often and thoroughly with soap and water for at least 20 seconds. You can use an alcohol based hand sanitizer if soap and water are not available and if your hands are not visibly dirty. Avoid touching your eyes, nose, and mouth with unwashed hands. Use disposable paper towels to dry your hands. If not available, use dedicated cloth towels and replace them when they become wet.  Wear a facemask and gloves Wear a disposable facemask at all times in the room and gloves when you touch or have contact with the patients blood, body fluids, and/or secretions or excretions, such as sweat, saliva, sputum, nasal mucus, vomit, urine, or feces.  Ensure the mask fits over your nose and mouth tightly, and do not touch it during use. Throw out disposable facemasks and gloves after using them. Do not reuse. Wash your hands immediately after removing your facemask and  gloves. If your personal clothing becomes contaminated, carefully remove clothing and launder. Wash your hands after handling contaminated clothing. Place all used disposable facemasks, gloves, and other waste in a lined container before disposing them with other household waste. Remove gloves and wash your hands immediately after handling these items.  Do not share dishes, glasses, or other household items with the patient Avoid sharing household items. You should not share dishes, drinking glasses, cups, eating utensils, towels, bedding, or other items with a patient who is confirmed to have, or being evaluated for, COVID-19 infection. After the person uses these items, you should wash them thoroughly with soap and water.  Wash laundry thoroughly Immediately remove and wash clothes or bedding that have blood, body fluids, and/or secretions or excretions, such as sweat, saliva, sputum, nasal mucus, vomit, urine, or feces, on them. Wear gloves when handling laundry from the patient. Read and follow directions on labels of laundry or clothing items and detergent. In general, wash and dry with the warmest temperatures recommended on the label.  Clean all areas the individual has used often Clean all touchable surfaces, such as counters, tabletops, doorknobs, bathroom fixtures, toilets, phones, keyboards, tablets, and bedside tables, every day. Also, clean any surfaces that may have blood, body fluids, and/or  secretions or excretions on them. Wear gloves when cleaning surfaces the patient has come in contact with. Use a diluted bleach solution (e.g., dilute bleach with 1 part bleach and 10 parts water) or a household disinfectant with a label that says EPA-registered for coronaviruses. To make a bleach solution at home, add 1 tablespoon of bleach to 1 quart (4 cups) of water. For a larger supply, add  cup of bleach to 1 gallon (16 cups) of water. Read labels of cleaning products and follow  recommendations provided on product labels. Labels contain instructions for safe and effective use of the cleaning product including precautions you should take when applying the product, such as wearing gloves or eye protection and making sure you have good ventilation during use of the product. Remove gloves and wash hands immediately after cleaning.  Monitor yourself for signs and symptoms of illness Caregivers and household members are considered close contacts, should monitor their health, and will be asked to limit movement outside of the home to the extent possible. Follow the monitoring steps for close contacts listed on the symptom monitoring form.   ? If you have additional questions, contact your local health department or call the epidemiologist on call at [phone] (available 24/7). ? This guidance is subject to change. For the most up-to-date guidance from [facility], please refer to their website: [email or web address]

## 2019-04-03 NOTE — ED Triage Notes (Signed)
Pt exposed to a coworker that has tested for covid but results haven't come back yet after getting a ride home from work last week.  Pt pain to right side of neck for past few days. Denies fevers or chills.

## 2019-04-04 LAB — NOVEL CORONAVIRUS, NAA (HOSP ORDER, SEND-OUT TO REF LAB; TAT 18-24 HRS): SARS-CoV-2, NAA: NOT DETECTED

## 2019-04-04 NOTE — ED Provider Notes (Signed)
[facility] EMERGENCY DEPARTMENT Provider Note   CSN: [id number] Arrival date & time: 04/03/19  1054    History   Chief Complaint Chief Complaint  Patient presents with  . covid exposure    HPI Harry Sullivan is a 28 y.o. male presenting for screening for Covid testing.  He tried to go to work today and he and his sister had accepted a car ride from a coworker 6 days ago who has developed cough symptoms, ? Fever,  her Covid test is still pending.  P must have a negative Covid test before he can return to work. He denies fevers, chills, cough, sore throat, no sob, cp, abd pain, n/v, loss of smell. He has noticed mild soreness across his right neck and shoulder the past 2 days, denies injury.       The history is provided by the patient.    History reviewed. No pertinent past medical history.  There are no active problems to display for this patient.   History reviewed. No pertinent surgical history.      Home Medications    Prior to Admission medications   Not on File    Family History History reviewed. No pertinent family history.  Social History Social History   Tobacco Use  . Smoking status: Current Every Day Smoker    Packs/day: 0.50    Types: Cigarettes  . Smokeless tobacco: Never Used  Substance Use Topics  . Alcohol use: Yes    Alcohol/week: 1.0 standard drinks    Types: 1 Shots of liquor per week    Comment: every other week   . Drug use: Yes    Types: Marijuana     Allergies   Patient has no known allergies.   Review of Systems Review of Systems  Constitutional: Negative for fever.  HENT: Negative for congestion and sore throat.   Eyes: Negative.   Respiratory: Negative for chest tightness and shortness of breath.   Cardiovascular: Negative for chest pain.  Gastrointestinal: Negative for abdominal pain and nausea.  Genitourinary: Negative.   Musculoskeletal: Positive for neck pain. Negative for arthralgias, joint swelling and neck  stiffness.  Skin: Negative.  Negative for rash and wound.  Neurological: Negative for dizziness, weakness, light-headedness, numbness and headaches.  Psychiatric/Behavioral: Negative.   All other systems reviewed and are negative.    Physical Exam Updated Vital Signs BP 120/80   Pulse 60   Temp 98.1 F (36.7 C) (Oral)   Resp 18   Ht 6\' 1"  (1.854 m)   Wt 68.9 kg   SpO2 100%   BMI 20.05 kg/m   Physical Exam Vitals signs and nursing note reviewed.  Constitutional:      Appearance: He is well-developed.  HENT:     Head: Normocephalic and atraumatic.     Nose: Nose normal.     Mouth/Throat:     Mouth: Mucous membranes are moist.     Pharynx: Oropharynx is clear.  Eyes:     Conjunctiva/sclera: Conjunctivae normal.  Neck:     Musculoskeletal: Normal range of motion.  Cardiovascular:     Rate and Rhythm: Normal rate and regular rhythm.     Heart sounds: Normal heart sounds.  Pulmonary:     Effort: Pulmonary effort is normal.     Breath sounds: Normal breath sounds. No wheezing.  Musculoskeletal: Normal range of motion.       Arms:  Skin:    General: Skin is warm and dry.  Neurological:  Mental Status: He is alert.      ED Treatments / Results  Labs (all labs ordered are listed, but only abnormal results are displayed) Labs Reviewed  NOVEL CORONAVIRUS, NAA (HOSPITAL ORDER, SEND-OUT TO REF LAB)    EKG None  Radiology No results found.  Procedures Procedures (including critical care time)  Medications Ordered in ED Medications - No data to display   Initial Impression / Assessment and Plan / ED Course  I have reviewed the triage vital signs and the nursing notes.  Pertinent labs & imaging results that were available during my care of the patient were reviewed by me and considered in my medical decision making (see chart for details).        Pt requiring Covid screening results prior to being able to return to work. At this point, he does not  even know if he has been exposed to Covid as co workers test has not resulted.  Testing performed. Work note given stating he can return once testing is negative, in the interim, he was advised home quarantine until results are negative.  Harry Sullivan was evaluated in Emergency Department on 04/04/2019 for the symptoms described in the history of present illness. He was evaluated in the context of the global COVID-19 pandemic, which necessitated consideration that the patient might be at risk for infection with the SARS-CoV-2 virus that causes COVID-19. Institutional protocols and algorithms that pertain to the evaluation of patients at risk for COVID-19 are in a state of rapid change based on information released by regulatory bodies including the CDC and federal and state organizations. These policies and algorithms were followed during the patient's care in the ED.   Final Clinical Impressions(s) / ED Diagnoses   Final diagnoses:  Worried well    ED Discharge Orders    None       Victoriano Laindol, Ayme Short, PA-C 04/04/19 0750    Bethann BerkshireZammit, Joseph, MD 04/12/19 [id number]

## 2019-04-29 ENCOUNTER — Other Ambulatory Visit: Payer: Self-pay

## 2019-04-29 DIAGNOSIS — Z20822 Contact with and (suspected) exposure to covid-19: Secondary | ICD-10-CM

## 2019-04-30 LAB — NOVEL CORONAVIRUS, NAA: SARS-CoV-2, NAA: NOT DETECTED

## 2019-05-03 ENCOUNTER — Telehealth: Payer: Self-pay | Admitting: General Practice

## 2019-05-03 NOTE — Telephone Encounter (Signed)
Pt called in for covid result.  °Advised of Not Detected result.  °
